# Patient Record
Sex: Female | Born: 1945
Health system: Southern US, Community
[De-identification: ages and names within clinical notes are randomized; demographics above are authoritative.]

## PROBLEM LIST (undated history)

## (undated) DIAGNOSIS — A809 Acute poliomyelitis, unspecified: Secondary | ICD-10-CM

## (undated) HISTORY — DX: Acute poliomyelitis, unspecified: A80.9

---

## 2012-12-08 ENCOUNTER — Other Ambulatory Visit: Payer: Self-pay

## 2012-12-08 DIAGNOSIS — Z1231 Encounter for screening mammogram for malignant neoplasm of breast: Secondary | ICD-10-CM

## 2013-01-06 ENCOUNTER — Ambulatory Visit
Admission: RE | Admit: 2013-01-06 | Discharge: 2013-01-06 | Disposition: A | Discharge status: Home / Self Care | Payer: Medicare Other | Source: Ambulatory Visit

## 2013-01-06 DIAGNOSIS — Z1231 Encounter for screening mammogram for malignant neoplasm of breast: Secondary | ICD-10-CM

## 2013-01-10 ENCOUNTER — Other Ambulatory Visit: Payer: Self-pay | Admitting: Obstetrics and Gynecology

## 2013-01-10 DIAGNOSIS — R928 Other abnormal and inconclusive findings on diagnostic imaging of breast: Secondary | ICD-10-CM

## 2013-01-12 ENCOUNTER — Other Ambulatory Visit: Payer: Self-pay | Admitting: Internal Medicine

## 2013-01-12 DIAGNOSIS — R928 Other abnormal and inconclusive findings on diagnostic imaging of breast: Secondary | ICD-10-CM

## 2013-01-24 ENCOUNTER — Ambulatory Visit
Admission: RE | Admit: 2013-01-24 | Discharge: 2013-01-24 | Disposition: A | Discharge status: Home / Self Care | Payer: Medicare Other | Source: Ambulatory Visit | Attending: Obstetrics and Gynecology | Admitting: Obstetrics and Gynecology

## 2013-01-24 DIAGNOSIS — R928 Other abnormal and inconclusive findings on diagnostic imaging of breast: Secondary | ICD-10-CM

## 2015-09-23 DIAGNOSIS — H9313 Tinnitus, bilateral: Secondary | ICD-10-CM | POA: Diagnosis not present

## 2015-09-23 DIAGNOSIS — H903 Sensorineural hearing loss, bilateral: Secondary | ICD-10-CM | POA: Diagnosis not present

## 2016-04-27 DIAGNOSIS — Z1231 Encounter for screening mammogram for malignant neoplasm of breast: Secondary | ICD-10-CM | POA: Diagnosis not present

## 2016-04-27 DIAGNOSIS — Z6825 Body mass index (BMI) 25.0-25.9, adult: Secondary | ICD-10-CM | POA: Diagnosis not present

## 2016-04-27 DIAGNOSIS — Z01419 Encounter for gynecological examination (general) (routine) without abnormal findings: Secondary | ICD-10-CM | POA: Diagnosis not present

## 2016-04-29 DIAGNOSIS — Z23 Encounter for immunization: Secondary | ICD-10-CM | POA: Diagnosis not present

## 2016-04-29 DIAGNOSIS — E559 Vitamin D deficiency, unspecified: Secondary | ICD-10-CM | POA: Diagnosis not present

## 2016-04-29 DIAGNOSIS — E785 Hyperlipidemia, unspecified: Secondary | ICD-10-CM | POA: Diagnosis not present

## 2016-04-29 DIAGNOSIS — Z0000 Encounter for general adult medical examination without abnormal findings: Secondary | ICD-10-CM | POA: Diagnosis not present

## 2016-05-06 DIAGNOSIS — F17211 Nicotine dependence, cigarettes, in remission: Secondary | ICD-10-CM | POA: Diagnosis not present

## 2016-05-06 DIAGNOSIS — R9431 Abnormal electrocardiogram [ECG] [EKG]: Secondary | ICD-10-CM | POA: Diagnosis not present

## 2016-05-06 DIAGNOSIS — Z0001 Encounter for general adult medical examination with abnormal findings: Secondary | ICD-10-CM | POA: Diagnosis not present

## 2016-05-06 DIAGNOSIS — N182 Chronic kidney disease, stage 2 (mild): Secondary | ICD-10-CM | POA: Diagnosis not present

## 2016-06-10 DIAGNOSIS — R600 Localized edema: Secondary | ICD-10-CM | POA: Diagnosis not present

## 2016-06-23 DIAGNOSIS — Z23 Encounter for immunization: Secondary | ICD-10-CM | POA: Diagnosis not present

## 2016-06-23 DIAGNOSIS — D1800 Hemangioma unspecified site: Secondary | ICD-10-CM | POA: Diagnosis not present

## 2016-06-23 DIAGNOSIS — D225 Melanocytic nevi of trunk: Secondary | ICD-10-CM | POA: Diagnosis not present

## 2016-06-23 DIAGNOSIS — L309 Dermatitis, unspecified: Secondary | ICD-10-CM | POA: Diagnosis not present

## 2016-06-23 DIAGNOSIS — L853 Xerosis cutis: Secondary | ICD-10-CM | POA: Diagnosis not present

## 2016-06-23 DIAGNOSIS — L814 Other melanin hyperpigmentation: Secondary | ICD-10-CM | POA: Diagnosis not present

## 2016-06-23 DIAGNOSIS — L821 Other seborrheic keratosis: Secondary | ICD-10-CM | POA: Diagnosis not present

## 2016-07-31 DIAGNOSIS — H5213 Myopia, bilateral: Secondary | ICD-10-CM | POA: Diagnosis not present

## 2016-10-22 DIAGNOSIS — L719 Rosacea, unspecified: Secondary | ICD-10-CM | POA: Diagnosis not present

## 2017-01-19 DIAGNOSIS — L719 Rosacea, unspecified: Secondary | ICD-10-CM | POA: Diagnosis not present

## 2017-05-04 DIAGNOSIS — Z6826 Body mass index (BMI) 26.0-26.9, adult: Secondary | ICD-10-CM | POA: Diagnosis not present

## 2017-05-04 DIAGNOSIS — Z01419 Encounter for gynecological examination (general) (routine) without abnormal findings: Secondary | ICD-10-CM | POA: Diagnosis not present

## 2017-05-04 DIAGNOSIS — Z1231 Encounter for screening mammogram for malignant neoplasm of breast: Secondary | ICD-10-CM | POA: Diagnosis not present

## 2017-05-04 DIAGNOSIS — Z124 Encounter for screening for malignant neoplasm of cervix: Secondary | ICD-10-CM | POA: Diagnosis not present

## 2017-05-04 DIAGNOSIS — M8588 Other specified disorders of bone density and structure, other site: Secondary | ICD-10-CM | POA: Diagnosis not present

## 2017-05-06 DIAGNOSIS — Z7982 Long term (current) use of aspirin: Secondary | ICD-10-CM | POA: Diagnosis not present

## 2017-05-06 DIAGNOSIS — Z0000 Encounter for general adult medical examination without abnormal findings: Secondary | ICD-10-CM | POA: Diagnosis not present

## 2017-05-06 DIAGNOSIS — E785 Hyperlipidemia, unspecified: Secondary | ICD-10-CM | POA: Diagnosis not present

## 2017-05-11 DIAGNOSIS — E785 Hyperlipidemia, unspecified: Secondary | ICD-10-CM | POA: Diagnosis not present

## 2017-05-11 DIAGNOSIS — F17211 Nicotine dependence, cigarettes, in remission: Secondary | ICD-10-CM | POA: Diagnosis not present

## 2017-05-11 DIAGNOSIS — M810 Age-related osteoporosis without current pathological fracture: Secondary | ICD-10-CM | POA: Diagnosis not present

## 2017-05-11 DIAGNOSIS — Z0000 Encounter for general adult medical examination without abnormal findings: Secondary | ICD-10-CM | POA: Diagnosis not present

## 2017-05-31 ENCOUNTER — Telehealth: Payer: Self-pay | Admitting: Acute Care

## 2017-06-01 NOTE — Telephone Encounter (Signed)
Routing to lung nodule pool for follow up.  

## 2017-06-04 NOTE — Telephone Encounter (Signed)
Called and spoke with pt and she is aware that DP will be calling her back next week.

## 2017-06-07 NOTE — Telephone Encounter (Signed)
LMTC x 1  

## 2017-06-14 ENCOUNTER — Institutional Professional Consult (permissible substitution): Payer: Self-pay | Admitting: Acute Care

## 2017-06-14 ENCOUNTER — Other Ambulatory Visit: Payer: Self-pay | Admitting: Acute Care

## 2017-06-14 ENCOUNTER — Telehealth: Payer: Self-pay | Admitting: Acute Care

## 2017-06-14 DIAGNOSIS — Z122 Encounter for screening for malignant neoplasm of respiratory organs: Secondary | ICD-10-CM

## 2017-06-14 DIAGNOSIS — Z87891 Personal history of nicotine dependence: Secondary | ICD-10-CM

## 2017-06-14 NOTE — Telephone Encounter (Signed)
Will close this message and refer to referral notes 

## 2017-06-14 NOTE — Telephone Encounter (Signed)
error 

## 2017-06-14 NOTE — Telephone Encounter (Signed)
LMTC x 1  

## 2017-06-21 ENCOUNTER — Encounter: Payer: Self-pay | Admitting: Acute Care

## 2017-06-21 ENCOUNTER — Inpatient Hospital Stay: Admission: RE | Admit: 2017-06-21 | Payer: Self-pay | Source: Ambulatory Visit

## 2017-06-24 ENCOUNTER — Telehealth: Payer: Self-pay | Admitting: Acute Care

## 2017-06-25 NOTE — Telephone Encounter (Signed)
Routing to lung nodule pool for follow up.  

## 2017-06-25 NOTE — Telephone Encounter (Signed)
LMTC x 1  

## 2017-06-25 NOTE — Telephone Encounter (Signed)
[facility] Nothing further needed

## 2017-06-29 DIAGNOSIS — D1800 Hemangioma unspecified site: Secondary | ICD-10-CM | POA: Diagnosis not present

## 2017-06-29 DIAGNOSIS — L814 Other melanin hyperpigmentation: Secondary | ICD-10-CM | POA: Diagnosis not present

## 2017-06-29 DIAGNOSIS — D485 Neoplasm of uncertain behavior of skin: Secondary | ICD-10-CM | POA: Diagnosis not present

## 2017-06-29 DIAGNOSIS — L570 Actinic keratosis: Secondary | ICD-10-CM | POA: Diagnosis not present

## 2017-06-29 DIAGNOSIS — L821 Other seborrheic keratosis: Secondary | ICD-10-CM | POA: Diagnosis not present

## 2017-07-08 DIAGNOSIS — Z1211 Encounter for screening for malignant neoplasm of colon: Secondary | ICD-10-CM | POA: Diagnosis not present

## 2017-07-08 DIAGNOSIS — Z86010 Personal history of colonic polyps: Secondary | ICD-10-CM | POA: Diagnosis not present

## 2017-07-08 DIAGNOSIS — K5730 Diverticulosis of large intestine without perforation or abscess without bleeding: Secondary | ICD-10-CM | POA: Diagnosis not present

## 2017-07-09 ENCOUNTER — Encounter: Payer: Self-pay | Admitting: Acute Care

## 2017-07-09 ENCOUNTER — Ambulatory Visit (INDEPENDENT_AMBULATORY_CARE_PROVIDER_SITE_OTHER)
Admission: RE | Admit: 2017-07-09 | Discharge: 2017-07-09 | Disposition: A | Discharge status: Home / Self Care | Payer: Medicare Other | Source: Ambulatory Visit | Attending: Acute Care | Admitting: Acute Care

## 2017-07-09 ENCOUNTER — Ambulatory Visit (INDEPENDENT_AMBULATORY_CARE_PROVIDER_SITE_OTHER): Payer: Medicare Other | Admitting: Acute Care

## 2017-07-09 DIAGNOSIS — Z87891 Personal history of nicotine dependence: Secondary | ICD-10-CM

## 2017-07-09 DIAGNOSIS — Z122 Encounter for screening for malignant neoplasm of respiratory organs: Secondary | ICD-10-CM

## 2017-07-09 DIAGNOSIS — Z23 Encounter for immunization: Secondary | ICD-10-CM | POA: Diagnosis not present

## 2017-07-09 NOTE — Progress Notes (Signed)
Shared Decision Making Visit Lung Cancer Screening Program [phone]([id number])   Eligibility:  Age 71 y.o.  Pack Years Smoking History Calculation 45 pack year smoking history (# packs/per year x # years smoked)  Recent History of coughing up blood  no  Unexplained weight loss? no ( >Than 15 pounds within the last 6 months )  Prior History Lung / other cancer no (Diagnosis within the last 5 years already requiring surveillance chest CT Scans).  Smoking Status Former Smoker  Former Smokers: Years since quit: 10 years  Quit Date: 2008  Visit Components:  Discussion included one or more decision making aids. yes  Discussion included risk/benefits of screening. yes  Discussion included potential follow up diagnostic testing for abnormal scans. yes  Discussion included meaning and risk of over diagnosis. yes  Discussion included meaning and risk of False Positives. yes  Discussion included meaning of total radiation exposure. yes  Counseling Included:  Importance of adherence to annual lung cancer LDCT screening. yes  Impact of comorbidities on ability to participate in the program. yes  Ability and willingness to under diagnostic treatment. yes  Smoking Cessation Counseling:  Current Smokers:   Discussed importance of smoking cessation. NA, former smoker  Information about tobacco cessation classes and interventions provided to patient. yes  Patient provided with "ticket" for LDCT Scan. yes  Symptomatic Patient. no  Counseling  Diagnosis Code: Tobacco Use Z72.0  Asymptomatic Patient yes  Counseling (Intermediate counseling: > three minutes counseling) [id number]  Former Smokers:   Discussed the importance of maintaining cigarette abstinence. yes  Diagnosis Code: Personal History of Nicotine Dependence. U98.119Z87.891  Information about tobacco cessation classes and interventions provided to patient. Yes  Patient provided with "ticket" for LDCT Scan. yes  Written Order  for Lung Cancer Screening with LDCT placed in Epic. Yes (CT Chest Lung Cancer Screening Low Dose W/O CM) [id number] Z12.2-Screening of respiratory organs Z87.891-Personal history of nicotine dependence  I spent 25 minutes of face to face time with Ms. Edgell discussing the risks and benefits of lung cancer screening. We viewed a power point together that explained in detail the above noted topics. We took the time to pause the power point at intervals to allow for questions to be asked and answered to ensure understanding. We discussed that she had taken the single most powerful action possible to decrease her risk of developing lung cancer when she quit smoking. I counseled her to remain smoke free, and to contact me if she ever had the desire to smoke again so that I can provide resources and tools to help support the effort to remain smoke free. We discussed the time and location of the scan, and that either  Abigail Miyamotoenise Phelps RN or I will call with the results within  24-48 hours of receiving them. She has my card and contact information in the event she needs to speak with me, in addition to a copy of the power point we reviewed as a resource. She verbalized understanding of all of the above and had no further questions upon leaving the office.     I explained to the patient that there has been a high incidence of coronary artery disease noted on these exams. I explained that this is a non-gated exam therefore degree or severity cannot be determined. This patient is not on statin therapy. I have asked the patient to follow-up with their PCP regarding any incidental finding of coronary artery disease and management with diet or medication as they feel  is clinically indicated. The patient verbalized understanding of the above and had no further questions.    Bevelyn NgoSarah F Groce, NP 07/09/2017

## 2017-07-15 ENCOUNTER — Telehealth: Payer: Self-pay | Admitting: Acute Care

## 2017-07-15 DIAGNOSIS — M4180 Other forms of scoliosis, site unspecified: Secondary | ICD-10-CM | POA: Diagnosis not present

## 2017-07-15 DIAGNOSIS — M25551 Pain in right hip: Secondary | ICD-10-CM | POA: Diagnosis not present

## 2017-07-15 DIAGNOSIS — Z87891 Personal history of nicotine dependence: Secondary | ICD-10-CM

## 2017-07-15 DIAGNOSIS — Z122 Encounter for screening for malignant neoplasm of respiratory organs: Secondary | ICD-10-CM

## 2017-07-15 DIAGNOSIS — M25552 Pain in left hip: Secondary | ICD-10-CM | POA: Diagnosis not present

## 2017-07-15 NOTE — Telephone Encounter (Signed)
Pt informed of CT results per Sarah Groce, NP.  PT verbalized understanding.  Copy sent to PCP.  Order placed for 1 yr f/u CT.  

## 2017-08-06 DIAGNOSIS — Z1211 Encounter for screening for malignant neoplasm of colon: Secondary | ICD-10-CM | POA: Diagnosis not present

## 2017-08-06 DIAGNOSIS — Z86010 Personal history of colonic polyps: Secondary | ICD-10-CM | POA: Diagnosis not present

## 2017-08-06 DIAGNOSIS — K5730 Diverticulosis of large intestine without perforation or abscess without bleeding: Secondary | ICD-10-CM | POA: Diagnosis not present

## 2017-09-13 DIAGNOSIS — L570 Actinic keratosis: Secondary | ICD-10-CM | POA: Diagnosis not present

## 2017-09-13 DIAGNOSIS — Z23 Encounter for immunization: Secondary | ICD-10-CM | POA: Diagnosis not present

## 2017-10-15 DIAGNOSIS — H5211 Myopia, right eye: Secondary | ICD-10-CM | POA: Diagnosis not present

## 2018-03-23 DIAGNOSIS — Z23 Encounter for immunization: Secondary | ICD-10-CM | POA: Diagnosis not present

## 2018-05-16 DIAGNOSIS — Z1231 Encounter for screening mammogram for malignant neoplasm of breast: Secondary | ICD-10-CM | POA: Diagnosis not present

## 2018-05-16 DIAGNOSIS — Z6825 Body mass index (BMI) 25.0-25.9, adult: Secondary | ICD-10-CM | POA: Diagnosis not present

## 2018-05-16 DIAGNOSIS — Z01419 Encounter for gynecological examination (general) (routine) without abnormal findings: Secondary | ICD-10-CM | POA: Diagnosis not present

## 2018-05-20 DIAGNOSIS — Z1159 Encounter for screening for other viral diseases: Secondary | ICD-10-CM | POA: Diagnosis not present

## 2018-05-20 DIAGNOSIS — E785 Hyperlipidemia, unspecified: Secondary | ICD-10-CM | POA: Diagnosis not present

## 2018-05-20 DIAGNOSIS — Z7982 Long term (current) use of aspirin: Secondary | ICD-10-CM | POA: Diagnosis not present

## 2018-05-25 DIAGNOSIS — M810 Age-related osteoporosis without current pathological fracture: Secondary | ICD-10-CM | POA: Diagnosis not present

## 2018-05-25 DIAGNOSIS — E785 Hyperlipidemia, unspecified: Secondary | ICD-10-CM | POA: Diagnosis not present

## 2018-05-25 DIAGNOSIS — I351 Nonrheumatic aortic (valve) insufficiency: Secondary | ICD-10-CM | POA: Diagnosis not present

## 2018-05-25 DIAGNOSIS — Z0000 Encounter for general adult medical examination without abnormal findings: Secondary | ICD-10-CM | POA: Diagnosis not present

## 2018-06-28 DIAGNOSIS — D225 Melanocytic nevi of trunk: Secondary | ICD-10-CM | POA: Diagnosis not present

## 2018-06-28 DIAGNOSIS — Z23 Encounter for immunization: Secondary | ICD-10-CM | POA: Diagnosis not present

## 2018-06-28 DIAGNOSIS — L821 Other seborrheic keratosis: Secondary | ICD-10-CM | POA: Diagnosis not present

## 2018-06-28 DIAGNOSIS — L814 Other melanin hyperpigmentation: Secondary | ICD-10-CM | POA: Diagnosis not present

## 2018-07-11 ENCOUNTER — Ambulatory Visit (INDEPENDENT_AMBULATORY_CARE_PROVIDER_SITE_OTHER)
Admission: RE | Admit: 2018-07-11 | Discharge: 2018-07-11 | Disposition: A | Discharge status: Home / Self Care | Payer: Medicare Other | Source: Ambulatory Visit | Attending: Acute Care | Admitting: Acute Care

## 2018-07-11 DIAGNOSIS — Z87891 Personal history of nicotine dependence: Secondary | ICD-10-CM | POA: Diagnosis not present

## 2018-07-11 DIAGNOSIS — Z122 Encounter for screening for malignant neoplasm of respiratory organs: Secondary | ICD-10-CM

## 2018-07-15 ENCOUNTER — Other Ambulatory Visit: Payer: Self-pay | Admitting: Acute Care

## 2018-07-15 DIAGNOSIS — Z87891 Personal history of nicotine dependence: Secondary | ICD-10-CM

## 2018-07-15 DIAGNOSIS — Z122 Encounter for screening for malignant neoplasm of respiratory organs: Secondary | ICD-10-CM

## 2018-11-14 DIAGNOSIS — M7711 Lateral epicondylitis, right elbow: Secondary | ICD-10-CM | POA: Diagnosis not present

## 2019-01-30 DIAGNOSIS — Z961 Presence of intraocular lens: Secondary | ICD-10-CM | POA: Diagnosis not present

## 2019-01-30 DIAGNOSIS — D3131 Benign neoplasm of right choroid: Secondary | ICD-10-CM | POA: Diagnosis not present

## 2019-02-14 DIAGNOSIS — M25522 Pain in left elbow: Secondary | ICD-10-CM | POA: Diagnosis not present

## 2019-02-14 DIAGNOSIS — M7711 Lateral epicondylitis, right elbow: Secondary | ICD-10-CM | POA: Diagnosis not present

## 2019-04-18 DIAGNOSIS — M25521 Pain in right elbow: Secondary | ICD-10-CM | POA: Diagnosis not present

## 2019-04-18 DIAGNOSIS — M7711 Lateral epicondylitis, right elbow: Secondary | ICD-10-CM | POA: Diagnosis not present

## 2019-04-24 DIAGNOSIS — M25521 Pain in right elbow: Secondary | ICD-10-CM | POA: Diagnosis not present

## 2019-04-25 DIAGNOSIS — Z0120 Encounter for dental examination and cleaning without abnormal findings: Secondary | ICD-10-CM | POA: Diagnosis not present

## 2019-04-27 DIAGNOSIS — M25521 Pain in right elbow: Secondary | ICD-10-CM | POA: Diagnosis not present

## 2019-05-02 DIAGNOSIS — M25521 Pain in right elbow: Secondary | ICD-10-CM | POA: Diagnosis not present

## 2019-05-05 DIAGNOSIS — M25521 Pain in right elbow: Secondary | ICD-10-CM | POA: Diagnosis not present

## 2019-05-11 DIAGNOSIS — M25521 Pain in right elbow: Secondary | ICD-10-CM | POA: Diagnosis not present

## 2019-05-26 DIAGNOSIS — Z7982 Long term (current) use of aspirin: Secondary | ICD-10-CM | POA: Diagnosis not present

## 2019-05-26 DIAGNOSIS — E785 Hyperlipidemia, unspecified: Secondary | ICD-10-CM | POA: Diagnosis not present

## 2019-05-26 DIAGNOSIS — M810 Age-related osteoporosis without current pathological fracture: Secondary | ICD-10-CM | POA: Diagnosis not present

## 2019-05-30 DIAGNOSIS — M810 Age-related osteoporosis without current pathological fracture: Secondary | ICD-10-CM | POA: Diagnosis not present

## 2019-05-31 DIAGNOSIS — Z0000 Encounter for general adult medical examination without abnormal findings: Secondary | ICD-10-CM | POA: Diagnosis not present

## 2019-05-31 DIAGNOSIS — M810 Age-related osteoporosis without current pathological fracture: Secondary | ICD-10-CM | POA: Diagnosis not present

## 2019-05-31 DIAGNOSIS — I700 Atherosclerosis of aorta: Secondary | ICD-10-CM | POA: Diagnosis not present

## 2019-05-31 DIAGNOSIS — J439 Emphysema, unspecified: Secondary | ICD-10-CM | POA: Diagnosis not present

## 2019-06-01 DIAGNOSIS — M25521 Pain in right elbow: Secondary | ICD-10-CM | POA: Diagnosis not present

## 2019-06-13 DIAGNOSIS — M7711 Lateral epicondylitis, right elbow: Secondary | ICD-10-CM | POA: Diagnosis not present

## 2019-07-18 ENCOUNTER — Other Ambulatory Visit: Payer: Self-pay

## 2019-07-18 ENCOUNTER — Ambulatory Visit (INDEPENDENT_AMBULATORY_CARE_PROVIDER_SITE_OTHER)
Admission: RE | Admit: 2019-07-18 | Discharge: 2019-07-18 | Disposition: A | Discharge status: Home / Self Care | Payer: Medicare Other | Source: Ambulatory Visit | Attending: Acute Care | Admitting: Acute Care

## 2019-07-18 DIAGNOSIS — Z122 Encounter for screening for malignant neoplasm of respiratory organs: Secondary | ICD-10-CM

## 2019-07-18 DIAGNOSIS — Z87891 Personal history of nicotine dependence: Secondary | ICD-10-CM | POA: Diagnosis not present

## 2019-07-20 ENCOUNTER — Other Ambulatory Visit: Payer: Self-pay | Admitting: *Deleted

## 2019-07-20 DIAGNOSIS — Z87891 Personal history of nicotine dependence: Secondary | ICD-10-CM

## 2019-07-25 DIAGNOSIS — M1990 Unspecified osteoarthritis, unspecified site: Secondary | ICD-10-CM | POA: Insufficient documentation

## 2019-07-25 DIAGNOSIS — M810 Age-related osteoporosis without current pathological fracture: Secondary | ICD-10-CM | POA: Insufficient documentation

## 2019-07-27 DIAGNOSIS — Z6826 Body mass index (BMI) 26.0-26.9, adult: Secondary | ICD-10-CM | POA: Diagnosis not present

## 2019-07-27 DIAGNOSIS — Z01419 Encounter for gynecological examination (general) (routine) without abnormal findings: Secondary | ICD-10-CM | POA: Diagnosis not present

## 2019-07-27 DIAGNOSIS — Z1231 Encounter for screening mammogram for malignant neoplasm of breast: Secondary | ICD-10-CM | POA: Diagnosis not present

## 2019-07-28 DIAGNOSIS — H903 Sensorineural hearing loss, bilateral: Secondary | ICD-10-CM | POA: Diagnosis not present

## 2019-08-04 ENCOUNTER — Ambulatory Visit: Payer: Medicare Other | Attending: Internal Medicine

## 2019-08-04 DIAGNOSIS — Z23 Encounter for immunization: Secondary | ICD-10-CM

## 2019-08-04 NOTE — Progress Notes (Signed)
   Covid-19 Vaccination Clinic  Name:  Sheryl Arias    MRN: [id number] DOB: 1946-05-17  08/04/2019  Sheryl Arias was observed post Covid-19 immunization for 15 minutes without incidence. She was provided with Vaccine Information Sheet and instruction to access the V-Safe system.   Sheryl Arias was instructed to call 911 with any severe reactions post vaccine: Marland Kitchen Difficulty breathing  . Swelling of your face and throat  . A fast heartbeat  . A bad rash all over your body  . Dizziness and weakness    Immunizations Administered    Name Date Dose VIS Date Route   Pfizer COVID-19 Vaccine 08/04/2019  1:46 PM 0.3 mL 06/23/2019 Intramuscular   Manufacturer: [employer], [employer]   Lot: [id number]   NDC: [address]-4

## 2019-08-25 ENCOUNTER — Ambulatory Visit: Payer: Medicare Other | Attending: Internal Medicine

## 2019-08-25 DIAGNOSIS — Z23 Encounter for immunization: Secondary | ICD-10-CM

## 2019-08-25 NOTE — Progress Notes (Signed)
   Covid-19 Vaccination Clinic  Name:  Sheryl Arias    MRN: [id number] DOB: 01-Aug-1945  08/25/2019  Ms. Sheryl Arias was observed post Covid-19 immunization for 15 minutes without incidence. She was provided with Vaccine Information Sheet and instruction to access the V-Safe system.   Ms. Sheryl Arias was instructed to call 911 with any severe reactions post vaccine: Marland Kitchen Difficulty breathing  . Swelling of your face and throat  . A fast heartbeat  . A bad rash all over your body  . Dizziness and weakness    Immunizations Administered    Name Date Dose VIS Date Route   Pfizer COVID-19 Vaccine 08/25/2019  8:39 AM 0.3 mL 06/23/2019 Intramuscular   Manufacturer: [employer], [employer]   Lot: [id number]   NDC: [address]-8

## 2019-09-15 DIAGNOSIS — M7711 Lateral epicondylitis, right elbow: Secondary | ICD-10-CM | POA: Diagnosis not present

## 2019-09-21 DIAGNOSIS — Z0120 Encounter for dental examination and cleaning without abnormal findings: Secondary | ICD-10-CM | POA: Diagnosis not present

## 2019-11-08 DIAGNOSIS — M542 Cervicalgia: Secondary | ICD-10-CM | POA: Diagnosis not present

## 2019-11-13 DIAGNOSIS — M542 Cervicalgia: Secondary | ICD-10-CM | POA: Diagnosis not present

## 2019-11-27 DIAGNOSIS — M542 Cervicalgia: Secondary | ICD-10-CM | POA: Diagnosis not present

## 2019-12-12 DIAGNOSIS — L570 Actinic keratosis: Secondary | ICD-10-CM | POA: Diagnosis not present

## 2020-02-01 DIAGNOSIS — H5213 Myopia, bilateral: Secondary | ICD-10-CM | POA: Diagnosis not present

## 2020-04-09 DIAGNOSIS — Z0120 Encounter for dental examination and cleaning without abnormal findings: Secondary | ICD-10-CM | POA: Diagnosis not present

## 2020-04-16 ENCOUNTER — Ambulatory Visit: Payer: Medicare Other | Attending: Internal Medicine

## 2020-04-16 DIAGNOSIS — Z23 Encounter for immunization: Secondary | ICD-10-CM

## 2020-04-16 NOTE — Progress Notes (Signed)
   Covid-19 Vaccination Clinic  Name:  Sheryl Arias    MRN: [id number] DOB: 1946-04-23  04/16/2020  Sheryl Arias was observed post Covid-19 immunization for 15 minutes without incident. She was provided with Vaccine Information Sheet and instruction to access the V-Safe system.   Sheryl Arias was instructed to call 911 with any severe reactions post vaccine: Marland Kitchen Difficulty breathing  . Swelling of face and throat  . A fast heartbeat  . A bad rash all over body  . Dizziness and weakness

## 2020-06-26 DIAGNOSIS — J439 Emphysema, unspecified: Secondary | ICD-10-CM | POA: Diagnosis not present

## 2020-06-26 DIAGNOSIS — Z7982 Long term (current) use of aspirin: Secondary | ICD-10-CM | POA: Diagnosis not present

## 2020-06-26 DIAGNOSIS — E785 Hyperlipidemia, unspecified: Secondary | ICD-10-CM | POA: Diagnosis not present

## 2020-07-03 DIAGNOSIS — M7542 Impingement syndrome of left shoulder: Secondary | ICD-10-CM | POA: Diagnosis not present

## 2020-07-03 DIAGNOSIS — M810 Age-related osteoporosis without current pathological fracture: Secondary | ICD-10-CM | POA: Diagnosis not present

## 2020-07-03 DIAGNOSIS — Z0000 Encounter for general adult medical examination without abnormal findings: Secondary | ICD-10-CM | POA: Diagnosis not present

## 2020-07-03 DIAGNOSIS — J439 Emphysema, unspecified: Secondary | ICD-10-CM | POA: Diagnosis not present

## 2020-07-03 DIAGNOSIS — I700 Atherosclerosis of aorta: Secondary | ICD-10-CM | POA: Diagnosis not present

## 2020-07-04 DIAGNOSIS — M25551 Pain in right hip: Secondary | ICD-10-CM | POA: Diagnosis not present

## 2020-07-04 DIAGNOSIS — M7061 Trochanteric bursitis, right hip: Secondary | ICD-10-CM | POA: Diagnosis not present

## 2020-07-08 DIAGNOSIS — L814 Other melanin hyperpigmentation: Secondary | ICD-10-CM | POA: Diagnosis not present

## 2020-07-08 DIAGNOSIS — D225 Melanocytic nevi of trunk: Secondary | ICD-10-CM | POA: Diagnosis not present

## 2020-07-08 DIAGNOSIS — Z23 Encounter for immunization: Secondary | ICD-10-CM | POA: Diagnosis not present

## 2020-07-08 DIAGNOSIS — L821 Other seborrheic keratosis: Secondary | ICD-10-CM | POA: Diagnosis not present

## 2020-07-15 ENCOUNTER — Telehealth: Payer: Self-pay | Admitting: Acute Care

## 2020-07-16 NOTE — Telephone Encounter (Signed)
Left message for the patient to return my call to schedule her LCS CT left my phone # [phone]

## 2020-07-16 NOTE — Telephone Encounter (Signed)
I now have spoken with Mrs. Kitchings and her LCS CT has been scheduled on 08/07/2020 @ 9:00am at [facility]

## 2020-07-16 NOTE — Telephone Encounter (Signed)
Synetta Fail, can you contact pt to schedule her f/u low dose screening CT?

## 2020-08-07 ENCOUNTER — Ambulatory Visit (INDEPENDENT_AMBULATORY_CARE_PROVIDER_SITE_OTHER)
Admission: RE | Admit: 2020-08-07 | Discharge: 2020-08-07 | Disposition: A | Discharge status: Home / Self Care | Payer: Medicare Other | Source: Ambulatory Visit | Attending: Internal Medicine | Admitting: Internal Medicine

## 2020-08-07 ENCOUNTER — Other Ambulatory Visit: Payer: Self-pay

## 2020-08-07 DIAGNOSIS — Z6826 Body mass index (BMI) 26.0-26.9, adult: Secondary | ICD-10-CM | POA: Diagnosis not present

## 2020-08-07 DIAGNOSIS — Z87891 Personal history of nicotine dependence: Secondary | ICD-10-CM | POA: Diagnosis not present

## 2020-08-07 DIAGNOSIS — Z1231 Encounter for screening mammogram for malignant neoplasm of breast: Secondary | ICD-10-CM | POA: Diagnosis not present

## 2020-08-07 DIAGNOSIS — Z01419 Encounter for gynecological examination (general) (routine) without abnormal findings: Secondary | ICD-10-CM | POA: Diagnosis not present

## 2020-08-13 DIAGNOSIS — L814 Other melanin hyperpigmentation: Secondary | ICD-10-CM | POA: Diagnosis not present

## 2020-08-13 DIAGNOSIS — L821 Other seborrheic keratosis: Secondary | ICD-10-CM | POA: Diagnosis not present

## 2020-08-15 NOTE — Progress Notes (Signed)
Please call patient and let them  know their  low dose Ct was read as a Lung RADS 2: nodules that are benign in appearance and behavior with a very low likelihood of becoming a clinically active cancer due to size or lack of growth. Recommendation per radiology is for a repeat LDCT in 12 months. .Please let them  know we will order and schedule their  annual screening scan for 07/2021. Please let them  know there was notation of CAD on their  scan.  Please remind the patient  that this is a non-gated exam therefore degree or severity of disease  cannot be determined. Please have them  follow up with their PCP regarding potential risk factor modification, dietary therapy or pharmacologic therapy if clinically indicated. Pt.  is not  currently on statin therapy. Please place order for annual  screening scan for  07/2021 and fax results to PCP. Thanks so much.  There is notation of CAD on the scan. Patient is not on statin therapy that I can see in Epic.Marland Kitchen Please have her follow up with her PCP regarding potential risk factor modification, dietary therapy or pharmacologic therapy if clinically indicated. Thanks so much

## 2020-08-19 ENCOUNTER — Other Ambulatory Visit: Payer: Self-pay | Admitting: *Deleted

## 2020-08-19 DIAGNOSIS — Z87891 Personal history of nicotine dependence: Secondary | ICD-10-CM

## 2020-08-26 ENCOUNTER — Other Ambulatory Visit: Payer: Self-pay

## 2020-08-26 ENCOUNTER — Encounter: Payer: Self-pay | Admitting: Cardiology

## 2020-08-26 ENCOUNTER — Ambulatory Visit: Payer: Medicare Other | Admitting: Cardiology

## 2020-08-26 VITALS — BP 136/72 | HR 64 | Ht 63.0 in | Wt 151.0 lb

## 2020-08-26 DIAGNOSIS — Z79899 Other long term (current) drug therapy: Secondary | ICD-10-CM | POA: Diagnosis not present

## 2020-08-26 DIAGNOSIS — I700 Atherosclerosis of aorta: Secondary | ICD-10-CM | POA: Diagnosis not present

## 2020-08-26 DIAGNOSIS — Z7189 Other specified counseling: Secondary | ICD-10-CM

## 2020-08-26 DIAGNOSIS — Z7182 Exercise counseling: Secondary | ICD-10-CM

## 2020-08-26 DIAGNOSIS — I2510 Atherosclerotic heart disease of native coronary artery without angina pectoris: Secondary | ICD-10-CM | POA: Diagnosis not present

## 2020-08-26 DIAGNOSIS — Z713 Dietary counseling and surveillance: Secondary | ICD-10-CM

## 2020-08-26 MED ORDER — ROSUVASTATIN CALCIUM 5 MG PO TABS: 5.0000 mg | ORAL_TABLET | Freq: Every day | ORAL | 3 refills | Status: DC

## 2020-08-26 NOTE — Patient Instructions (Addendum)
Medication Instructions:  Start Rosuvastatin 5 mg daily  *If you need a refill on your cardiac medications before your next appointment, please call your pharmacy*   Lab Work: Your physician recommends that you return for lab work in 2-3 month (Fasting Lipid, LFT).  If you have labs (blood work) drawn today and your tests are completely normal, you will receive your results only by: Marland Kitchen MyChart Message (if you have MyChart) OR . A paper copy in the mail If you have any lab test that is abnormal or we need to change your treatment, we will call you to review the results.   Testing/Procedures: None   Follow-Up: At [facility], you and your health needs are our priority.  As part of our continuing mission to provide you with exceptional heart care, we have created designated Provider Care Teams.  These Care Teams include your primary Cardiologist (physician) and Advanced Practice Providers (APPs -  Physician Assistants and Nurse Practitioners) who all work together to provide you with the care you need, when you need it.  We recommend signing up for the patient portal called "MyChart".  Sign up information is provided on this After Visit Summary.  MyChart is used to connect with patients for Virtual Visits (Telemedicine).  Patients are able to view lab/test results, encounter notes, upcoming appointments, etc.  Non-urgent messages can be sent to your provider as well.   To learn more about what you can do with MyChart, go to [email or web address].    Your next appointment:   1 year(s)  The format for your next appointment:   In Person  Provider:   Jodelle Red, MD      Mediterranean Diet A Mediterranean diet refers to food and lifestyle choices that are based on the traditions of countries located on the [place]. This way of eating has been shown to help prevent certain conditions and improve outcomes for people who have chronic diseases, like kidney disease  and heart disease. What are tips for following this plan? Lifestyle  Cook and eat meals together with your family, when possible.  Drink enough fluid to keep your urine clear or pale yellow.  Be physically active every day. This includes: ? Aerobic exercise like running or swimming. ? Leisure activities like gardening, walking, or housework.  Get 7-8 hours of sleep each night.  If recommended by your health care provider, drink red wine in moderation. This means 1 glass a day for nonpregnant women and 2 glasses a day for men. A glass of wine equals 5 oz (150 mL). Reading food labels  Check the serving size of packaged foods. For foods such as rice and pasta, the serving size refers to the amount of cooked product, not dry.  Check the total fat in packaged foods. Avoid foods that have saturated fat or trans fats.  Check the ingredients list for added sugars, such as corn syrup.   Shopping  At the grocery store, buy most of your food from the areas near the walls of the store. This includes: ? Fresh fruits and vegetables (produce). ? Grains, beans, nuts, and seeds. Some of these may be available in unpackaged forms or large amounts (in bulk). ? Fresh seafood. ? Poultry and eggs. ? Low-fat dairy products.  Buy whole ingredients instead of prepackaged foods.  Buy fresh fruits and vegetables in-season from local farmers markets.  Buy frozen fruits and vegetables in resealable bags.  If you do not have access to quality fresh seafood, buy  precooked frozen shrimp or canned fish, such as tuna, salmon, or sardines.  Buy small amounts of raw or cooked vegetables, salads, or olives from the deli or salad bar at your store.  Stock your pantry so you always have certain foods on hand, such as olive oil, canned tuna, canned tomatoes, rice, pasta, and beans. Cooking  Cook foods with extra-virgin olive oil instead of using butter or other vegetable oils.  Have meat as a side dish, and  have vegetables or grains as your main dish. This means having meat in small portions or adding small amounts of meat to foods like pasta or stew.  Use beans or vegetables instead of meat in common dishes like chili or lasagna.  Experiment with different cooking methods. Try roasting or broiling vegetables instead of steaming or sauteing them.  Add frozen vegetables to soups, stews, pasta, or rice.  Add nuts or seeds for added healthy fat at each meal. You can add these to yogurt, salads, or vegetable dishes.  Marinate fish or vegetables using olive oil, lemon juice, garlic, and fresh herbs. Meal planning  Plan to eat 1 vegetarian meal one day each week. Try to work up to 2 vegetarian meals, if possible.  Eat seafood 2 or more times a week.  Have healthy snacks readily available, such as: ? Vegetable sticks with hummus. ? [place] yogurt. ? Fruit and nut trail mix.  Eat balanced meals throughout the week. This includes: ? Fruit: 2-3 servings a day ? Vegetables: 4-5 servings a day ? Low-fat dairy: 2 servings a day ? Fish, poultry, or lean meat: 1 serving a day ? Beans and legumes: 2 or more servings a week ? Nuts and seeds: 1-2 servings a day ? Whole grains: 6-8 servings a day ? Extra-virgin olive oil: 3-4 servings a day  Limit red meat and sweets to only a few servings a month   What are my food choices?  Mediterranean diet ? Recommended  Grains: Whole-grain pasta. Brown rice. Bulgar wheat. Polenta. Couscous. Whole-wheat bread. Orpah Cobb.  Vegetables: Artichokes. Beets. Broccoli. Cabbage. Carrots. Eggplant. Green beans. Chard. Kale. Spinach. Onions. Leeks. Peas. Squash. Tomatoes. Peppers. Radishes.  Fruits: Apples. Apricots. Avocado. Berries. Bananas. Cherries. Dates. Figs. Grapes. Lemons. Melon. Oranges. Peaches. Plums. Pomegranate.  Meats and other protein foods: Beans. Almonds. Sunflower seeds. Pine nuts. Peanuts. Cod. Salmon. Scallops. Shrimp. Tuna. Tilapia. Clams.  Oysters. Eggs.  Dairy: Low-fat milk. Cheese. Greek yogurt.  Beverages: Water. Red wine. Herbal tea.  Fats and oils: Extra virgin olive oil. Avocado oil. Grape seed oil.  Sweets and desserts: [place] yogurt with honey. Baked apples. Poached pears. Trail mix.  Seasoning and other foods: Basil. Cilantro. Coriander. Cumin. Mint. Parsley. Sage. Rosemary. Tarragon. Garlic. Oregano. Thyme. Pepper. Balsalmic vinegar. Tahini. Hummus. Tomato sauce. Olives. Mushrooms. ? Limit these  Grains: Prepackaged pasta or rice dishes. Prepackaged cereal with added sugar.  Vegetables: Deep fried potatoes (french fries).  Fruits: Fruit canned in syrup.  Meats and other protein foods: Beef. Pork. Lamb. Poultry with skin. Hot dogs. Tomasa Blase.  Dairy: Ice cream. Sour cream. Whole milk.  Beverages: Juice. Sugar-sweetened soft drinks. Beer. Liquor and spirits.  Fats and oils: Butter. Canola oil. Vegetable oil. Beef fat (tallow). Lard.  Sweets and desserts: Cookies. Cakes. Pies. Candy.  Seasoning and other foods: Mayonnaise. Premade sauces and marinades. The items listed may not be a complete list. Talk with your dietitian about what dietary choices are right for you. Summary  The Mediterranean diet includes both food and  lifestyle choices.  Eat a variety of fresh fruits and vegetables, beans, nuts, seeds, and whole grains.  Limit the amount of red meat and sweets that you eat.  Talk with your health care provider about whether it is safe for you to drink red wine in moderation. This means 1 glass a day for nonpregnant women and 2 glasses a day for men. A glass of wine equals 5 oz (150 mL). This information is not intended to replace advice given to you by your health care provider. Make sure you discuss any questions you have with your health care provider. Document Revised: 02/27/2016 Document Reviewed: 02/20/2016 Elsevier Patient Education  2020 [employer].

## 2020-08-26 NOTE — Progress Notes (Signed)
Cardiology Office Note:    Date:  08/26/2020   ID:  Sheryl Arias, DOB 01-Sep-1945, MRN [id number]  PCP:  Merri Brunette, MD  Cardiologist:  Jodelle Red, MD  Referring MD: Merri Brunette, MD   CC: new patient evaluation for coronary atherosclerosis  History of Present Illness:    Sheryl Arias is a 75 y.o. female without cardiac history who is seen as a new consult at the request of Merri Brunette, MD for the evaluation and management of coronary atherosclerosis.  I have not received any records from Dr. Carolee Rota office. She reports that she is here due to the findings on her CT scan. She is not sure why but she reports that her insurance shows charges from Dr. Eden Emms from December for reasons she is unsure of. I cannot see any reason/record of this.   We reviewed her CT scan 08/07/20. We also did extensive education today about coronary calcium, atherosclerosis, guidelines, statins, diet, and exercise, see below.  Cardiovascular risk factors: Prior clinical ASCVD: none Comorbid conditions: Denies hypertension, hyperlipidemia, diabetes, chronic kidney disease Metabolic syndrome/Obesity: highest adult weight 155 lbs Chronic inflammatory conditions: none Tobacco use history: started 1966, quit around ~2007. About 1 ppd. Alcohol: drinks several beers/night.  Family history: father had unclear heart disease, thinks it was afib as he got periodic cardioversions. None in her sister or brother Prior cardiac testing and/or incidental findings on other testing (ie coronary calcium): coronary and aortic calcium Exercise level: can walk, works full time as a sub in the school system, climbs stairs. Current diet: pasta is a favorite, likes vegetables but not fruit, eats chicken, fish, protein.  Denies chest pain (remote history of pleurisy, none in years), shortness of breath at rest or with normal exertion. No PND, orthopnea, LE edema or unexpected weight gain. No syncope or  palpitations.  Past Medical History:  Diagnosis Date  . Polio     History reviewed. No pertinent surgical history.  Current Medications: Current Outpatient Medications on File Prior to Visit  Medication Sig  . alendronate (FOSAMAX) 70 MG tablet TAKE 1 TABLET BY MOUTH ONCE A WEEK 30  MINUTES  BEFORE  THE  FIRST  FOOD,  BEVERAGE,  OR  MEDICINE  OF  THE  DAY  WITH  PLAIN  WATER  . Ascorbic Acid (VITAMIN C) 1000 MG tablet 1 tablet  . aspirin 81 MG EC tablet 1 tablet  . Calcium Citrate-Vitamin D 315-250 MG-UNIT TABS 2 tablets  . cholecalciferol (VITAMIN D3) 25 MCG (1000 UNIT) tablet 1 capsule  . Estradiol 10 MCG TABS vaginal tablet 1 tablet   No current facility-administered medications on file prior to visit.     Allergies:   Patient has no known allergies.   Social History   Tobacco Use  . Smoking status: Former Smoker    Packs/day: 1.00    Years: 45.00    Pack years: 45.00    Types: Cigarettes    Quit date: 2008    Years since quitting: 14.1  . Smokeless tobacco: Never Used    Family History: Father with afib. No other history of heart disease or stroke.  ROS:   Please see the history of present illness.  Additional pertinent ROS: Constitutional: Negative for chills, fever, night sweats, unintentional weight loss  HENT: Negative for ear pain and hearing loss.   Eyes: Negative for loss of vision and eye pain.  Respiratory: Negative for cough, sputum, wheezing.   Cardiovascular: See HPI. Gastrointestinal: Negative for abdominal pain, melena,  and hematochezia.  Genitourinary: Negative for dysuria and hematuria.  Musculoskeletal: Negative for falls and myalgias.  Skin: Negative for itching and rash.  Neurological: Negative for focal weakness, focal sensory changes and loss of consciousness.  Endo/Heme/Allergies: Does not bruise/bleed easily.     EKGs/Labs/Other Studies Reviewed:    The following studies were reviewed today: CT scan 08/07/20 Cardiovascular: Heart  size is normal. There is no significant pericardial fluid, thickening or pericardial calcification. There is aortic atherosclerosis, as well as atherosclerosis of the great vessels of the mediastinum and the coronary arteries, including calcified atherosclerotic plaque in the left anterior descending and right coronary arteries  EKG:  EKG is personally reviewed.  The ekg ordered today demonstrates NSR   Recent Labs: No results found for requested labs within last 8760 hours.  Recent Lipid Panel No results found for: CHOL, TRIG, HDL, CHOLHDL, VLDL, LDLCALC, LDLDIRECT  Physical Exam:    VS:  BP 136/72   Pulse 64   Ht 5\' 3"  (1.6 m)   Wt 151 lb (68.5 kg)   BMI 26.75 kg/m     Wt Readings from Last 3 Encounters:  08/26/20 151 lb (68.5 kg)    GEN: Well nourished, well developed in no acute distress HEENT: Normal, moist mucous membranes NECK: No JVD CARDIAC: regular rhythm, normal S1 and S2, no rubs or gallops. No murmur. VASCULAR: Radial and DP pulses 2+ bilaterally. No carotid bruits RESPIRATORY:  Clear to auscultation without rales, wheezing or rhonchi  ABDOMEN: Soft, non-tender, non-distended MUSCULOSKELETAL:  Ambulates independently SKIN: Warm and dry, no edema NEUROLOGIC:  Alert and oriented x 3. No focal neuro deficits noted. PSYCHIATRIC:  Normal affect    ASSESSMENT:    1. Coronary artery calcification seen on CT scan   2. Aortic atherosclerosis (HCC)   3. Medication management   4. Cardiac risk counseling   5. Counseling on health promotion and disease prevention   6. Nutritional counseling   7. Exercise counseling    PLAN:    Coronary artery calcification Aortic atherosclerosis Hypercholesterolemia -We reviewed the calcium finding at length, including actual images as well as the graph showing mortality based on calcium score. We discussed the pathophysiology of cholesterol plaque formation, the role of calcium and why it is a marker, how plaque is key to acute  MI/CVA, and how known plaque is managed with medications.  --discussed pathology of cholesterol, how plaques form, that MI/CVA result commonly from acute plaque rupture and not gradual stenosis. Discussed mechanism of statin to both decrease plaque accumulation and stabilize plaque that is already present. Discussed that calcium is a marker for plaque, with decades of validated data regarding average amounts of calcium for age/gender/ethnicity, as well as value of calcium score for risk stratification -we discussed the data on statins, both in terms of their long term benefit as well as the risk of side effects. Reviewed common misconceptions about statins. Reviewed how we monitor treatment. After shared decision making, patient is agreeable to trialing statin. -continue aspirin 81 mg daily. -recheck lipids/LFTs in 2-3 mos Lipids per [facility] 06/26/20: Tchol 231, HDL 128, LDL 95, TG 40 -LDL goal <70 -counseled on red flag warning signs that need immediate medical attention  Cardiac risk counseling and prevention recommendations: -recommend heart healthy/Mediterranean diet, with whole grains, fruits, vegetable, fish, lean meats, nuts, and olive oil. Limit salt. -recommend moderate walking, 3-5 times/week for 30-50 minutes each session. Aim for at least 150 minutes.week. Goal should be pace of 3 miles/hours, or walking 1.5  miles in 30 minutes -recommend avoidance of tobacco products. Avoid excess alcohol.  Plan for follow up: 1 year or sooner PRN  Jodelle RedBridgette Banks Chaikin, MD, PhD, [facility] [place]  [facility]    Medication Adjustments/Labs and Tests Ordered: Current medicines are reviewed at length with the patient today.  Concerns regarding medicines are outlined above.  Orders Placed This Encounter  Procedures  . Lipid panel  . Hepatic function panel  . EKG 12-Lead   Meds ordered this encounter  Medications  . rosuvastatin (CRESTOR) 5 MG tablet    Sig: Take 1 tablet (5 mg total) by mouth  daily.    Dispense:  90 tablet    Refill:  3    Patient Instructions   Medication Instructions:  Start Rosuvastatin 5 mg daily  *If you need a refill on your cardiac medications before your next appointment, please call your pharmacy*   Lab Work: Your physician recommends that you return for lab work in 2-3 month (Fasting Lipid, LFT).  If you have labs (blood work) drawn today and your tests are completely normal, you will receive your results only by: Marland Kitchen. MyChart Message (if you have MyChart) OR . A paper copy in the mail If you have any lab test that is abnormal or we need to change your treatment, we will call you to review the results.   Testing/Procedures: None   Follow-Up: At [facility], you and your health needs are our priority.  As part of our continuing mission to provide you with exceptional heart care, we have created designated Provider Care Teams.  These Care Teams include your primary Cardiologist (physician) and Advanced Practice Providers (APPs -  Physician Assistants and Nurse Practitioners) who all work together to provide you with the care you need, when you need it.  We recommend signing up for the patient portal called "MyChart".  Sign up information is provided on this After Visit Summary.  MyChart is used to connect with patients for Virtual Visits (Telemedicine).  Patients are able to view lab/test results, encounter notes, upcoming appointments, etc.  Non-urgent messages can be sent to your provider as well.   To learn more about what you can do with MyChart, go to [email or web address].    Your next appointment:   1 year(s)  The format for your next appointment:   In Person  Provider:   Jodelle RedBridgette Marinna Blane, MD      Mediterranean Diet A Mediterranean diet refers to food and lifestyle choices that are based on the traditions of countries located on the [place]. This way of eating has been shown to help prevent certain conditions  and improve outcomes for people who have chronic diseases, like kidney disease and heart disease. What are tips for following this plan? Lifestyle  Cook and eat meals together with your family, when possible.  Drink enough fluid to keep your urine clear or pale yellow.  Be physically active every day. This includes: ? Aerobic exercise like running or swimming. ? Leisure activities like gardening, walking, or housework.  Get 7-8 hours of sleep each night.  If recommended by your health care provider, drink red wine in moderation. This means 1 glass a day for nonpregnant women and 2 glasses a day for men. A glass of wine equals 5 oz (150 mL). Reading food labels  Check the serving size of packaged foods. For foods such as rice and pasta, the serving size refers to the amount of cooked product, not dry.  Check the  total fat in packaged foods. Avoid foods that have saturated fat or trans fats.  Check the ingredients list for added sugars, such as corn syrup.   Shopping  At the grocery store, buy most of your food from the areas near the walls of the store. This includes: ? Fresh fruits and vegetables (produce). ? Grains, beans, nuts, and seeds. Some of these may be available in unpackaged forms or large amounts (in bulk). ? Fresh seafood. ? Poultry and eggs. ? Low-fat dairy products.  Buy whole ingredients instead of prepackaged foods.  Buy fresh fruits and vegetables in-season from local farmers markets.  Buy frozen fruits and vegetables in resealable bags.  If you do not have access to quality fresh seafood, buy precooked frozen shrimp or canned fish, such as tuna, salmon, or sardines.  Buy small amounts of raw or cooked vegetables, salads, or olives from the deli or salad bar at your store.  Stock your pantry so you always have certain foods on hand, such as olive oil, canned tuna, canned tomatoes, rice, pasta, and beans. Cooking  Cook foods with extra-virgin olive oil  instead of using butter or other vegetable oils.  Have meat as a side dish, and have vegetables or grains as your main dish. This means having meat in small portions or adding small amounts of meat to foods like pasta or stew.  Use beans or vegetables instead of meat in common dishes like chili or lasagna.  Experiment with different cooking methods. Try roasting or broiling vegetables instead of steaming or sauteing them.  Add frozen vegetables to soups, stews, pasta, or rice.  Add nuts or seeds for added healthy fat at each meal. You can add these to yogurt, salads, or vegetable dishes.  Marinate fish or vegetables using olive oil, lemon juice, garlic, and fresh herbs. Meal planning  Plan to eat 1 vegetarian meal one day each week. Try to work up to 2 vegetarian meals, if possible.  Eat seafood 2 or more times a week.  Have healthy snacks readily available, such as: ? Vegetable sticks with hummus. ? [place] yogurt. ? Fruit and nut trail mix.  Eat balanced meals throughout the week. This includes: ? Fruit: 2-3 servings a day ? Vegetables: 4-5 servings a day ? Low-fat dairy: 2 servings a day ? Fish, poultry, or lean meat: 1 serving a day ? Beans and legumes: 2 or more servings a week ? Nuts and seeds: 1-2 servings a day ? Whole grains: 6-8 servings a day ? Extra-virgin olive oil: 3-4 servings a day  Limit red meat and sweets to only a few servings a month   What are my food choices?  Mediterranean diet ? Recommended  Grains: Whole-grain pasta. Brown rice. Bulgar wheat. Polenta. Couscous. Whole-wheat bread. Orpah Cobb.  Vegetables: Artichokes. Beets. Broccoli. Cabbage. Carrots. Eggplant. Green beans. Chard. Kale. Spinach. Onions. Leeks. Peas. Squash. Tomatoes. Peppers. Radishes.  Fruits: Apples. Apricots. Avocado. Berries. Bananas. Cherries. Dates. Figs. Grapes. Lemons. Melon. Oranges. Peaches. Plums. Pomegranate.  Meats and other protein foods: Beans. Almonds.  Sunflower seeds. Pine nuts. Peanuts. Cod. Salmon. Scallops. Shrimp. Tuna. Tilapia. Clams. Oysters. Eggs.  Dairy: Low-fat milk. Cheese. Greek yogurt.  Beverages: Water. Red wine. Herbal tea.  Fats and oils: Extra virgin olive oil. Avocado oil. Grape seed oil.  Sweets and desserts: [place] yogurt with honey. Baked apples. Poached pears. Trail mix.  Seasoning and other foods: Basil. Cilantro. Coriander. Cumin. Mint. Parsley. Sage. Rosemary. Tarragon. Garlic. Oregano. Thyme. Pepper. Balsalmic vinegar. Tahini.  Hummus. Tomato sauce. Olives. Mushrooms. ? Limit these  Grains: Prepackaged pasta or rice dishes. Prepackaged cereal with added sugar.  Vegetables: Deep fried potatoes (french fries).  Fruits: Fruit canned in syrup.  Meats and other protein foods: Beef. Pork. Lamb. Poultry with skin. Hot dogs. Tomasa Blase.  Dairy: Ice cream. Sour cream. Whole milk.  Beverages: Juice. Sugar-sweetened soft drinks. Beer. Liquor and spirits.  Fats and oils: Butter. Canola oil. Vegetable oil. Beef fat (tallow). Lard.  Sweets and desserts: Cookies. Cakes. Pies. Candy.  Seasoning and other foods: Mayonnaise. Premade sauces and marinades. The items listed may not be a complete list. Talk with your dietitian about what dietary choices are right for you. Summary  The Mediterranean diet includes both food and lifestyle choices.  Eat a variety of fresh fruits and vegetables, beans, nuts, seeds, and whole grains.  Limit the amount of red meat and sweets that you eat.  Talk with your health care provider about whether it is safe for you to drink red wine in moderation. This means 1 glass a day for nonpregnant women and 2 glasses a day for men. A glass of wine equals 5 oz (150 mL). This information is not intended to replace advice given to you by your health care provider. Make sure you discuss any questions you have with your health care provider. Document Revised: 02/27/2016 Document Reviewed:  02/20/2016 Elsevier Patient Education  2020 [employer].    Signed, Jodelle Red, MD PhD 08/26/2020 9:33 AM    [facility]

## 2020-08-28 DIAGNOSIS — Z20822 Contact with and (suspected) exposure to covid-19: Secondary | ICD-10-CM | POA: Diagnosis not present

## 2020-08-28 DIAGNOSIS — Z03818 Encounter for observation for suspected exposure to other biological agents ruled out: Secondary | ICD-10-CM | POA: Diagnosis not present

## 2020-10-29 DIAGNOSIS — Z79899 Other long term (current) drug therapy: Secondary | ICD-10-CM | POA: Diagnosis not present

## 2020-10-29 LAB — LIPID PANEL
Chol/HDL Ratio: 1.7 ratio (ref 0.0–4.4)
Cholesterol, Total: 185 mg/dL (ref 100–199)
HDL: 112 mg/dL (ref >39)
LDL Chol Calc (NIH): 64 mg/dL (ref 0–99)
Triglycerides: 41 mg/dL (ref 0–149)
VLDL Cholesterol Cal: 9 mg/dL (ref 5–40)

## 2020-10-29 LAB — HEPATIC FUNCTION PANEL
ALT: 17 IU/L (ref 0–32)
AST: 17 IU/L (ref 0–40)
Albumin: 4.6 g/dL (ref 3.7–4.7)
Alkaline Phosphatase: 71 IU/L (ref 44–121)
Bilirubin Total: 0.4 mg/dL (ref 0.0–1.2)
Bilirubin, Direct: 0.16 mg/dL (ref 0.00–0.40)
Total Protein: 6.8 g/dL (ref 6.0–8.5)

## 2020-11-13 DIAGNOSIS — M7542 Impingement syndrome of left shoulder: Secondary | ICD-10-CM | POA: Diagnosis not present

## 2020-11-13 DIAGNOSIS — M79621 Pain in right upper arm: Secondary | ICD-10-CM | POA: Diagnosis not present

## 2020-11-18 ENCOUNTER — Encounter: Payer: Self-pay | Admitting: *Deleted

## 2021-02-06 DIAGNOSIS — D3131 Benign neoplasm of right choroid: Secondary | ICD-10-CM | POA: Diagnosis not present

## 2021-02-06 DIAGNOSIS — H53001 Unspecified amblyopia, right eye: Secondary | ICD-10-CM | POA: Diagnosis not present

## 2021-02-06 DIAGNOSIS — Z961 Presence of intraocular lens: Secondary | ICD-10-CM | POA: Diagnosis not present

## 2021-04-02 DIAGNOSIS — M7062 Trochanteric bursitis, left hip: Secondary | ICD-10-CM | POA: Diagnosis not present

## 2021-05-24 ENCOUNTER — Other Ambulatory Visit: Payer: Self-pay | Admitting: Cardiology

## 2021-05-24 DIAGNOSIS — I2510 Atherosclerotic heart disease of native coronary artery without angina pectoris: Secondary | ICD-10-CM

## 2021-05-24 DIAGNOSIS — I700 Atherosclerosis of aorta: Secondary | ICD-10-CM

## 2021-05-26 NOTE — Telephone Encounter (Signed)
Rx(s) sent to pharmacy electronically.  

## 2021-07-10 DIAGNOSIS — E559 Vitamin D deficiency, unspecified: Secondary | ICD-10-CM | POA: Diagnosis not present

## 2021-07-10 DIAGNOSIS — Z0000 Encounter for general adult medical examination without abnormal findings: Secondary | ICD-10-CM | POA: Diagnosis not present

## 2021-07-10 DIAGNOSIS — N390 Urinary tract infection, site not specified: Secondary | ICD-10-CM | POA: Diagnosis not present

## 2021-07-10 DIAGNOSIS — E785 Hyperlipidemia, unspecified: Secondary | ICD-10-CM | POA: Diagnosis not present

## 2021-07-15 DIAGNOSIS — D225 Melanocytic nevi of trunk: Secondary | ICD-10-CM | POA: Diagnosis not present

## 2021-07-15 DIAGNOSIS — L814 Other melanin hyperpigmentation: Secondary | ICD-10-CM | POA: Diagnosis not present

## 2021-07-15 DIAGNOSIS — C44321 Squamous cell carcinoma of skin of nose: Secondary | ICD-10-CM | POA: Diagnosis not present

## 2021-07-15 DIAGNOSIS — L821 Other seborrheic keratosis: Secondary | ICD-10-CM | POA: Diagnosis not present

## 2021-07-15 DIAGNOSIS — I2510 Atherosclerotic heart disease of native coronary artery without angina pectoris: Secondary | ICD-10-CM | POA: Diagnosis not present

## 2021-07-15 DIAGNOSIS — E559 Vitamin D deficiency, unspecified: Secondary | ICD-10-CM | POA: Diagnosis not present

## 2021-07-15 DIAGNOSIS — D485 Neoplasm of uncertain behavior of skin: Secondary | ICD-10-CM | POA: Diagnosis not present

## 2021-07-15 DIAGNOSIS — L578 Other skin changes due to chronic exposure to nonionizing radiation: Secondary | ICD-10-CM | POA: Diagnosis not present

## 2021-07-15 DIAGNOSIS — J439 Emphysema, unspecified: Secondary | ICD-10-CM | POA: Diagnosis not present

## 2021-07-15 DIAGNOSIS — M8589 Other specified disorders of bone density and structure, multiple sites: Secondary | ICD-10-CM | POA: Diagnosis not present

## 2021-07-15 DIAGNOSIS — Z0000 Encounter for general adult medical examination without abnormal findings: Secondary | ICD-10-CM | POA: Diagnosis not present

## 2021-08-05 DIAGNOSIS — M5412 Radiculopathy, cervical region: Secondary | ICD-10-CM | POA: Diagnosis not present

## 2021-08-12 ENCOUNTER — Other Ambulatory Visit: Payer: Self-pay | Admitting: Registered Nurse

## 2021-08-12 DIAGNOSIS — R911 Solitary pulmonary nodule: Secondary | ICD-10-CM

## 2021-08-21 ENCOUNTER — Ambulatory Visit (HOSPITAL_BASED_OUTPATIENT_CLINIC_OR_DEPARTMENT_OTHER): Payer: Medicare Other | Admitting: Cardiology

## 2021-08-27 ENCOUNTER — Ambulatory Visit (HOSPITAL_BASED_OUTPATIENT_CLINIC_OR_DEPARTMENT_OTHER): Payer: Medicare Other | Admitting: Cardiology

## 2021-08-27 ENCOUNTER — Other Ambulatory Visit: Payer: Self-pay

## 2021-08-27 ENCOUNTER — Encounter (HOSPITAL_BASED_OUTPATIENT_CLINIC_OR_DEPARTMENT_OTHER): Payer: Self-pay | Admitting: Cardiology

## 2021-08-27 VITALS — BP 114/64 | HR 63 | Ht 63.0 in | Wt 150.0 lb

## 2021-08-27 DIAGNOSIS — I2510 Atherosclerotic heart disease of native coronary artery without angina pectoris: Secondary | ICD-10-CM | POA: Diagnosis not present

## 2021-08-27 DIAGNOSIS — E7800 Pure hypercholesterolemia, unspecified: Secondary | ICD-10-CM

## 2021-08-27 DIAGNOSIS — R202 Paresthesia of skin: Secondary | ICD-10-CM | POA: Diagnosis not present

## 2021-08-27 DIAGNOSIS — I700 Atherosclerosis of aorta: Secondary | ICD-10-CM | POA: Diagnosis not present

## 2021-08-27 DIAGNOSIS — Z7189 Other specified counseling: Secondary | ICD-10-CM

## 2021-08-27 NOTE — Progress Notes (Signed)
Cardiology Office Note:    Date:  08/27/2021   ID:  Sheryl Arias, DOB [birth year], MRN [id number]  PCP:  Deland Pretty, MD  Cardiologist:  Buford Dresser, MD  Referring MD: Deland Pretty, MD   CC: follow-up   History of Present Illness:    Sheryl Arias is a 76 y.o. female without cardiac history who is seen today for follow-up. She was initially seen 08/26/20 as a new consult at the request of Deland Pretty, MD for the evaluation and management of coronary atherosclerosis.  Today: She is doing well with no major concerns. She reports her blood pressure typically runs on the low end of normal. She is tolerating her medications. Routinely, she takes all of her prescriptions in the morning.   Rarely, she experiences a sharp brief central chest pain. Her blood pressure during these episodes is normal.   She attends physical therapy for arthritis in her neck. If she sleeps in the wrong position, she will wake up with R arm numbness. She had one episode of waking up and feeling burning in her R thumb. She associates this pain to a pinched nerve.  She does not exercise regularly. 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 She is also able to complete housework.  Of note, she used to be a smoker but has quit.  Denies shortness of breath at rest or with normal exertion. No PND, orthopnea, LE edema or unexpected weight gain. No syncope or palpitations.  Past Medical History:  Diagnosis Date   Polio     No past surgical history on file.  Current Medications: Current Outpatient Medications on File Prior to Visit  Medication Sig   alendronate (FOSAMAX) 70 MG tablet TAKE 1 TABLET BY MOUTH ONCE A WEEK 30  MINUTES  BEFORE  THE  FIRST  FOOD,  BEVERAGE,  OR  MEDICINE  OF  THE  DAY  WITH  PLAIN  WATER   Ascorbic Acid (VITAMIN C) 1000 MG tablet 1 tablet   aspirin 81 MG EC tablet 1 tablet   Calcium Citrate-Vitamin D 315-250 MG-UNIT  TABS 2 tablets   cholecalciferol (VITAMIN D3) 25 MCG (1000 UNIT) tablet 1 capsule   Estradiol 10 MCG TABS vaginal tablet 1 tablet   rosuvastatin (CRESTOR) 5 MG tablet Take 1 tablet by mouth once daily   No current facility-administered medications on file prior to visit.     Allergies:   Patient has no known allergies.   Social History   Tobacco Use   Smoking status: Former    Packs/day: 1.00    Years: 45.00    Pack years: 45.00    Types: Cigarettes    Quit date: 2008    Years since quitting: 15.1   Smokeless tobacco: Never    Family History: Father with afib. No other history of heart disease or stroke.  ROS:   Please see the history of present illness.  Additional pertinent ROS: (+) Chest pain  EKGs/Labs/Other Studies Reviewed:    The following studies were reviewed today: CT scan 08/07/20 Cardiovascular: Heart size is normal. There is no significant pericardial fluid, thickening or pericardial calcification. There is aortic atherosclerosis, as well as atherosclerosis of the great vessels of the mediastinum and the coronary arteries, including calcified atherosclerotic plaque in the left anterior descending and right coronary arteries  EKG:  EKG is personally reviewed.   08/27/21: NSR at 63 bpm 08/26/20: NSR   Recent Labs: 10/29/2020: ALT  17  Recent Lipid Panel    Component Value Date/Time   CHOL 185 10/29/2020 0832   TRIG 41 10/29/2020 0832   HDL 112 10/29/2020 0832   CHOLHDL 1.7 10/29/2020 0832   LDLCALC 64 10/29/2020 0832    Physical Exam:    VS:  BP 114/64    Pulse 63    Ht 5\' 3"  (1.6 m)    Wt 150 lb (68 kg)    BMI 26.57 kg/m     Wt Readings from Last 3 Encounters:  08/27/21 150 lb (68 kg)  08/26/20 151 lb (68.5 kg)    GEN: Well nourished, well developed in no acute distress HEENT: Normal, moist mucous membranes NECK: No JVD CARDIAC: regular rhythm, normal S1 and S2, no rubs or gallops. No murmur. VASCULAR: Radial and DP pulses 2+ bilaterally.  No carotid bruits RESPIRATORY:  Clear to auscultation without rales, wheezing or rhonchi  ABDOMEN: Soft, non-tender, non-distended MUSCULOSKELETAL:  Ambulates independently SKIN: Warm and dry, no edema NEUROLOGIC:  Alert and oriented x 3. No focal neuro deficits noted. PSYCHIATRIC:  Normal affect    ASSESSMENT:    1. Aortic atherosclerosis ([place])   2. Coronary artery calcification seen on CT scan   3. Cardiac risk counseling   4. Counseling on health promotion and disease prevention   5. Pure hypercholesterolemia     PLAN:    Coronary artery calcification Aortic atherosclerosis Hypercholesterolemia -continue aspirin 81 mg daily. Lipids per [facility]  10/29/20: Tchol 185, HDL 112, LDL 64, TG 41 06/26/20: Tchol 231, HDL 128, LDL 95, TG 40 -LDL goal <70 -counseled on red flag warning signs that need immediate medical attention -ok to take rosuvastatin with her other medications  Labs from 07-15-21 reviewed from [facility]: Tchol 199, HDL 111, LDL 78, TG 48 TSH 1.82 CBC normal CMET unremarkable, Cr 0.83,  4.6  Cardiac risk counseling and prevention recommendations: -recommend heart healthy/Mediterranean diet, with whole grains, fruits, vegetable, fish, lean meats, nuts, and olive oil. Limit salt. -recommend moderate walking, 3-5 times/week for 30-50 minutes each session. Aim for at least 150 minutes.week. Goal should be pace of 3 miles/hours, or walking 1.5 miles in 30 minutes -recommend avoidance of tobacco products. Avoid excess alcohol.  Plan for follow up: 2 years  Buford Dresser, MD, PhD, [facility]    Medication Adjustments/Labs and Tests Ordered: Current medicines are reviewed at length with the patient today.  Concerns regarding medicines are outlined above.  Orders Placed This Encounter  Procedures   EKG 12-Lead   No orders of the defined types were placed in this encounter.   Patient Instructions  Medication Instructions:   Your Physician recommend you continue on your current medication as directed.    *If you need a refill on your cardiac medications before your next appointment, please call your pharmacy*   Lab Work: None ordered today  Testing/Procedures: None ordered today    Follow-Up: At [facility], you and your health needs are our priority.  As part of our continuing mission to provide you with exceptional heart care, we have created designated Provider Care Teams.  These Care Teams include your primary Cardiologist (physician) and Advanced Practice Providers (APPs -  Physician Assistants and Nurse Practitioners) who all work together to provide you with the care you need, when you need it.  We recommend signing up for the patient portal called "MyChart".  Sign up information is provided on this After Visit Summary.  MyChart is used  to connect with patients for Virtual Visits (Telemedicine).  Patients are able to view lab/test results, encounter notes, upcoming appointments, etc.  Non-urgent messages can be sent to your provider as well.   To learn more about what you can do with MyChart, go to [email or web address].    Your next appointment:   2 year(s)  The format for your next appointment:   In Person  Provider:   Buford Dresser, MD{    Wilhemina Bonito as a scribe for Buford Dresser, MD.,have documented all relevant documentation on the behalf of Buford Dresser, MD,as directed by  Buford Dresser, MD while in the presence of Buford Dresser, MD.  I, Buford Dresser, MD, have reviewed all documentation for this visit. The documentation on 08/27/21 for the exam, diagnosis, procedures, and orders are all accurate and complete.   Signed, Buford Dresser, MD PhD 08/27/2021 1:52 PM    [facility]

## 2021-08-27 NOTE — Patient Instructions (Signed)

## 2021-08-29 DIAGNOSIS — Z1231 Encounter for screening mammogram for malignant neoplasm of breast: Secondary | ICD-10-CM | POA: Diagnosis not present

## 2021-08-29 DIAGNOSIS — Z6826 Body mass index (BMI) 26.0-26.9, adult: Secondary | ICD-10-CM | POA: Diagnosis not present

## 2021-08-29 DIAGNOSIS — Z01419 Encounter for gynecological examination (general) (routine) without abnormal findings: Secondary | ICD-10-CM | POA: Diagnosis not present

## 2021-09-05 ENCOUNTER — Ambulatory Visit
Admission: RE | Admit: 2021-09-05 | Discharge: 2021-09-05 | Disposition: A | Discharge status: Home / Self Care | Payer: Medicare Other | Source: Ambulatory Visit | Attending: Registered Nurse | Admitting: Registered Nurse

## 2021-09-05 ENCOUNTER — Other Ambulatory Visit: Payer: Self-pay

## 2021-09-05 DIAGNOSIS — R911 Solitary pulmonary nodule: Secondary | ICD-10-CM | POA: Diagnosis not present

## 2021-09-05 DIAGNOSIS — J439 Emphysema, unspecified: Secondary | ICD-10-CM | POA: Diagnosis not present

## 2021-09-12 DIAGNOSIS — R202 Paresthesia of skin: Secondary | ICD-10-CM | POA: Diagnosis not present

## 2021-09-25 DIAGNOSIS — C44321 Squamous cell carcinoma of skin of nose: Secondary | ICD-10-CM | POA: Diagnosis not present

## 2021-09-26 DIAGNOSIS — R202 Paresthesia of skin: Secondary | ICD-10-CM | POA: Diagnosis not present

## 2021-10-02 DIAGNOSIS — M542 Cervicalgia: Secondary | ICD-10-CM | POA: Diagnosis not present

## 2021-10-02 DIAGNOSIS — M25512 Pain in left shoulder: Secondary | ICD-10-CM | POA: Diagnosis not present

## 2021-10-14 DIAGNOSIS — M25512 Pain in left shoulder: Secondary | ICD-10-CM | POA: Diagnosis not present

## 2021-12-30 DIAGNOSIS — L570 Actinic keratosis: Secondary | ICD-10-CM | POA: Diagnosis not present

## 2021-12-30 DIAGNOSIS — D0439 Carcinoma in situ of skin of other parts of face: Secondary | ICD-10-CM | POA: Diagnosis not present

## 2021-12-30 DIAGNOSIS — T1490XD Injury, unspecified, subsequent encounter: Secondary | ICD-10-CM | POA: Diagnosis not present

## 2021-12-30 DIAGNOSIS — D485 Neoplasm of uncertain behavior of skin: Secondary | ICD-10-CM | POA: Diagnosis not present

## 2022-02-09 DIAGNOSIS — Z961 Presence of intraocular lens: Secondary | ICD-10-CM | POA: Diagnosis not present

## 2022-02-09 DIAGNOSIS — D3131 Benign neoplasm of right choroid: Secondary | ICD-10-CM | POA: Diagnosis not present

## 2022-02-10 DIAGNOSIS — Z6826 Body mass index (BMI) 26.0-26.9, adult: Secondary | ICD-10-CM | POA: Diagnosis not present

## 2022-02-10 DIAGNOSIS — J069 Acute upper respiratory infection, unspecified: Secondary | ICD-10-CM | POA: Diagnosis not present

## 2022-02-10 DIAGNOSIS — R051 Acute cough: Secondary | ICD-10-CM | POA: Diagnosis not present

## 2022-02-14 DIAGNOSIS — J019 Acute sinusitis, unspecified: Secondary | ICD-10-CM | POA: Diagnosis not present

## 2022-02-14 DIAGNOSIS — H1032 Unspecified acute conjunctivitis, left eye: Secondary | ICD-10-CM | POA: Diagnosis not present

## 2022-02-20 ENCOUNTER — Other Ambulatory Visit: Payer: Self-pay | Admitting: Cardiology

## 2022-02-20 DIAGNOSIS — I2510 Atherosclerotic heart disease of native coronary artery without angina pectoris: Secondary | ICD-10-CM

## 2022-02-20 DIAGNOSIS — I700 Atherosclerosis of aorta: Secondary | ICD-10-CM

## 2022-02-27 DIAGNOSIS — D099 Carcinoma in situ, unspecified: Secondary | ICD-10-CM | POA: Diagnosis not present

## 2022-03-04 DIAGNOSIS — M25512 Pain in left shoulder: Secondary | ICD-10-CM | POA: Diagnosis not present

## 2022-03-07 DIAGNOSIS — M25512 Pain in left shoulder: Secondary | ICD-10-CM | POA: Diagnosis not present

## 2022-03-25 DIAGNOSIS — M25512 Pain in left shoulder: Secondary | ICD-10-CM | POA: Diagnosis not present

## 2022-03-25 DIAGNOSIS — M19112 Post-traumatic osteoarthritis, left shoulder: Secondary | ICD-10-CM | POA: Diagnosis not present

## 2022-03-25 DIAGNOSIS — M12819 Other specific arthropathies, not elsewhere classified, unspecified shoulder: Secondary | ICD-10-CM | POA: Insufficient documentation

## 2022-04-01 DIAGNOSIS — L821 Other seborrheic keratosis: Secondary | ICD-10-CM | POA: Diagnosis not present

## 2022-04-01 DIAGNOSIS — D099 Carcinoma in situ, unspecified: Secondary | ICD-10-CM | POA: Diagnosis not present

## 2022-05-05 DIAGNOSIS — N958 Other specified menopausal and perimenopausal disorders: Secondary | ICD-10-CM | POA: Diagnosis not present

## 2022-05-05 DIAGNOSIS — M8588 Other specified disorders of bone density and structure, other site: Secondary | ICD-10-CM | POA: Diagnosis not present

## 2022-05-07 DIAGNOSIS — L821 Other seborrheic keratosis: Secondary | ICD-10-CM | POA: Diagnosis not present

## 2022-07-02 DIAGNOSIS — D23111 Other benign neoplasm of skin of right upper eyelid, including canthus: Secondary | ICD-10-CM | POA: Diagnosis not present

## 2022-07-22 DIAGNOSIS — D225 Melanocytic nevi of trunk: Secondary | ICD-10-CM | POA: Diagnosis not present

## 2022-07-22 DIAGNOSIS — L578 Other skin changes due to chronic exposure to nonionizing radiation: Secondary | ICD-10-CM | POA: Diagnosis not present

## 2022-07-22 DIAGNOSIS — L814 Other melanin hyperpigmentation: Secondary | ICD-10-CM | POA: Diagnosis not present

## 2022-07-22 DIAGNOSIS — L821 Other seborrheic keratosis: Secondary | ICD-10-CM | POA: Diagnosis not present

## 2022-07-30 DIAGNOSIS — Z1211 Encounter for screening for malignant neoplasm of colon: Secondary | ICD-10-CM | POA: Diagnosis not present

## 2022-07-30 DIAGNOSIS — K5730 Diverticulosis of large intestine without perforation or abscess without bleeding: Secondary | ICD-10-CM | POA: Diagnosis not present

## 2022-08-05 DIAGNOSIS — N182 Chronic kidney disease, stage 2 (mild): Secondary | ICD-10-CM | POA: Diagnosis not present

## 2022-08-05 DIAGNOSIS — Z0000 Encounter for general adult medical examination without abnormal findings: Secondary | ICD-10-CM | POA: Diagnosis not present

## 2022-08-05 DIAGNOSIS — E785 Hyperlipidemia, unspecified: Secondary | ICD-10-CM | POA: Diagnosis not present

## 2022-08-05 DIAGNOSIS — N390 Urinary tract infection, site not specified: Secondary | ICD-10-CM | POA: Diagnosis not present

## 2022-08-05 DIAGNOSIS — E559 Vitamin D deficiency, unspecified: Secondary | ICD-10-CM | POA: Diagnosis not present

## 2022-08-12 DIAGNOSIS — E785 Hyperlipidemia, unspecified: Secondary | ICD-10-CM | POA: Diagnosis not present

## 2022-08-12 DIAGNOSIS — M810 Age-related osteoporosis without current pathological fracture: Secondary | ICD-10-CM | POA: Diagnosis not present

## 2022-08-12 DIAGNOSIS — I700 Atherosclerosis of aorta: Secondary | ICD-10-CM | POA: Diagnosis not present

## 2022-08-12 DIAGNOSIS — Z0000 Encounter for general adult medical examination without abnormal findings: Secondary | ICD-10-CM | POA: Diagnosis not present

## 2022-08-12 DIAGNOSIS — I2510 Atherosclerotic heart disease of native coronary artery without angina pectoris: Secondary | ICD-10-CM | POA: Diagnosis not present

## 2022-08-12 DIAGNOSIS — M25512 Pain in left shoulder: Secondary | ICD-10-CM | POA: Diagnosis not present

## 2022-08-17 DIAGNOSIS — Z1211 Encounter for screening for malignant neoplasm of colon: Secondary | ICD-10-CM | POA: Diagnosis not present

## 2022-08-17 DIAGNOSIS — Z1212 Encounter for screening for malignant neoplasm of rectum: Secondary | ICD-10-CM | POA: Diagnosis not present

## 2022-08-27 ENCOUNTER — Other Ambulatory Visit: Payer: Self-pay | Admitting: Cardiology

## 2022-08-27 DIAGNOSIS — I2510 Atherosclerotic heart disease of native coronary artery without angina pectoris: Secondary | ICD-10-CM

## 2022-08-27 DIAGNOSIS — I700 Atherosclerosis of aorta: Secondary | ICD-10-CM

## 2022-08-28 NOTE — Telephone Encounter (Signed)
Rx(s) sent to pharmacy electronically.  

## 2022-09-02 DIAGNOSIS — Z1231 Encounter for screening mammogram for malignant neoplasm of breast: Secondary | ICD-10-CM | POA: Diagnosis not present

## 2022-09-02 DIAGNOSIS — Z01419 Encounter for gynecological examination (general) (routine) without abnormal findings: Secondary | ICD-10-CM | POA: Diagnosis not present

## 2022-09-02 DIAGNOSIS — Z6825 Body mass index (BMI) 25.0-25.9, adult: Secondary | ICD-10-CM | POA: Diagnosis not present

## 2022-09-02 DIAGNOSIS — E785 Hyperlipidemia, unspecified: Secondary | ICD-10-CM | POA: Insufficient documentation

## 2022-09-09 ENCOUNTER — Encounter: Payer: Self-pay | Admitting: Pulmonary Disease

## 2022-09-09 ENCOUNTER — Encounter (HOSPITAL_BASED_OUTPATIENT_CLINIC_OR_DEPARTMENT_OTHER): Payer: Self-pay

## 2022-09-09 ENCOUNTER — Ambulatory Visit: Payer: Medicare Other | Admitting: Pulmonary Disease

## 2022-09-09 VITALS — BP 136/70 | HR 60 | Ht 63.0 in | Wt 145.0 lb

## 2022-09-09 DIAGNOSIS — J432 Centrilobular emphysema: Secondary | ICD-10-CM

## 2022-09-09 DIAGNOSIS — Z87891 Personal history of nicotine dependence: Secondary | ICD-10-CM

## 2022-09-09 MED ORDER — ALBUTEROL SULFATE HFA 108 (90 BASE) MCG/ACT IN AERS: 2.0000 | INHALATION_SPRAY | Freq: Four times a day (QID) | RESPIRATORY_TRACT | 6 refills | Status: DC | PRN

## 2022-09-09 NOTE — Patient Instructions (Addendum)
Try albuterol inhaler 1-2 puffs every 4-6 hours as needed. You can also try before going on a walk to see if it helps with shortness of breath.  Follow up in 3 months with pulmonary function tests

## 2022-09-09 NOTE — Progress Notes (Signed)
Synopsis: Referred in February 2024 for emphysema by Deland Pretty, MD  Subjective:   PATIENT ID: Sheryl Arias GENDER: female DOB: August 21, 1945, MRN: [id number]  HPI  Chief Complaint  Patient presents with   Consult    Referred by PCP for emphysema. States she only has SOB with exertion or when having to hurry. Denies any coughing or wheezing.    Sheryl Arias is a 77 year old woman, former smoker who is referred to pulmonary clinic for emphysema.   She has been enrolled in lung cancer screening program and LCS CT Chests have shown mild centrilobular emphysematous changes.   She has exertional shortness of breath when walking inclines or if she is in a hurry/rush. She denies wheezing, cough or sputum production. She has no night time awakenings due to cough, dyspnea or wheezing. She denies seasonal allergies. No issues with reflux.   She has 40 pack year history. She had second hand smoke exposure from parents in childhood. 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 No exposure to embalming fluids. No pets. Her sister has emphysema and is on oxygen.  Past Medical History:  Diagnosis Date   Polio      No family history on file.   Social History   Socioeconomic History   Marital status: Widowed    Spouse name: Not on file   Number of children: Not on file   Years of education: Not on file   Highest education level: Not on file  Occupational History   Not on file  Tobacco Use   Smoking status: Former    Packs/day: 1.00    Years: 45.00    Total pack years: 45.00    Types: Cigarettes    Quit date: 2008    Years since quitting: 16.1   Smokeless tobacco: Never  Substance and Sexual Activity   Alcohol use: Not on file   Drug use: Not on file   Sexual activity: Not on file  Other Topics Concern   Not on file  Social History Narrative   Not on file   Social  Determinants of Health   Financial Resource Strain: Not on file  Food Insecurity: Not on file  Transportation Needs: Not on file  Physical Activity: Not on file  Stress: Not on file  Social Connections: Not on file  Intimate Partner Violence: Not on file     No Known Allergies   Outpatient Medications Prior to Visit  Medication Sig Dispense Refill   alendronate (FOSAMAX) 70 MG tablet TAKE 1 TABLET BY MOUTH ONCE A WEEK 30  MINUTES  BEFORE  THE  FIRST  FOOD,  BEVERAGE,  OR  MEDICINE  OF  THE  DAY  WITH  PLAIN  WATER     Ascorbic Acid (VITAMIN C) 1000 MG tablet 1 tablet     aspirin 81 MG EC tablet 1 tablet     Calcium Citrate-Vitamin D 315-250 MG-UNIT TABS 2 tablets     cholecalciferol (VITAMIN D3) 25 MCG (1000 UNIT) tablet 1 capsule     Estradiol 10 MCG TABS vaginal tablet 1 tablet     rosuvastatin (CRESTOR) 5 MG tablet Take 1 tablet (5 mg total) by mouth daily. NEED APPOINTMENT 30 tablet 0   No facility-administered medications prior to visit.    Review of Systems  Constitutional:  Negative for chills, fever, malaise/fatigue and weight loss.  HENT:  Negative for congestion, sinus pain and sore throat.   Eyes: Negative.   Respiratory:  Positive for shortness of breath (with exertion). Negative for cough, hemoptysis, sputum production and wheezing.   Cardiovascular:  Negative for chest pain, palpitations, orthopnea, claudication and leg swelling.  Gastrointestinal:  Negative for abdominal pain, heartburn, nausea and vomiting.  Genitourinary: Negative.   Musculoskeletal:  Negative for joint pain and myalgias.  Skin:  Negative for rash.  Neurological:  Negative for weakness.  Endo/Heme/Allergies: Negative.   Psychiatric/Behavioral: Negative.     Objective:   Vitals:   09/09/22 0829  BP: 136/70  Pulse: 60  SpO2: 98%  Weight: 145 lb (65.8 kg)  Height: '5\' 3"'$  (1.6 m)   Physical Exam Constitutional:      General: She is not in acute distress.    Appearance: She is not  ill-appearing.  HENT:     Head: Normocephalic and atraumatic.  Eyes:     General: No scleral icterus.    Conjunctiva/sclera: Conjunctivae normal.     Pupils: Pupils are equal, round, and reactive to light.  Cardiovascular:     Rate and Rhythm: Normal rate and regular rhythm.     Pulses: Normal pulses.     Heart sounds: Normal heart sounds. No murmur heard. Pulmonary:     Effort: Pulmonary effort is normal.     Breath sounds: Normal breath sounds. No wheezing, rhonchi or rales.  Abdominal:     General: Bowel sounds are normal.     Palpations: Abdomen is soft.  Musculoskeletal:     Right lower leg: No edema.     Left lower leg: No edema.  Lymphadenopathy:     Cervical: No cervical adenopathy.  Skin:    General: Skin is warm and dry.  Neurological:     General: No focal deficit present.     Mental Status: She is alert.  Psychiatric:        Mood and Affect: Mood normal.        Behavior: Behavior normal.        Thought Content: Thought content normal.        Judgment: Judgment normal.       CBC No results found for: "WBC", "RBC", "HGB", "HCT", "PLT", "MCV", "MCH", "MCHC", "RDW", "LYMPHSABS", "MONOABS", "EOSABS", "BASOSABS"   Chest imaging: CT Chest 09/05/21 No indeterminate or suspicious focal pulmonary nodules identified. Dominant nodule within the left apex represents benign calcified granuloma.   Mild centrilobular emphysema.   Mild coronary artery calcification   Aortic Atherosclerosis (ICD10-I70.0) and Emphysema   PFT:     No data to display          Labs:  Path:  Echo:  Heart Catheterization:  Assessment & Plan:   Centrilobular emphysema (Sheryl Arias) - Plan: Pulmonary Function Test, albuterol (VENTOLIN HFA) 108 (90 Base) MCG/ACT inhaler  Former cigarette smoker - Plan: Ambulatory Referral for Lung Cancer Scre  Discussion: Sheryl Arias is a 77 year old woman, former smoker who is referred to pulmonary clinic for emphysema.   She has mild  centrilobular emphysema on CT Chest scans.   We will get her re-enrolled in the lung cancer screening program, she is due for CT chest scan.   She is to try albuterol inhaler 1-2 puffs every 4-6 hours as needed. She is overall very functional and experiences minimal episodes of dyspnea.   Follow up in 3 months with pulmonary function tests.   Sheryl Jackson, MD [facility] Office: [phone]  Current Outpatient Medications:    albuterol (VENTOLIN HFA) 108 (90 Base) MCG/ACT inhaler, Inhale 2 puffs into the lungs every 6 (six) hours as needed for wheezing or shortness of breath., Disp: 8 g, Rfl: 6   alendronate (FOSAMAX) 70 MG tablet, TAKE 1 TABLET BY MOUTH ONCE A WEEK 30  MINUTES  BEFORE  THE  FIRST  FOOD,  BEVERAGE,  OR  MEDICINE  OF  THE  DAY  WITH  PLAIN  WATER, Disp: , Rfl:    Ascorbic Acid (VITAMIN C) 1000 MG tablet, 1 tablet, Disp: , Rfl:    aspirin 81 MG EC tablet, 1 tablet, Disp: , Rfl:    Calcium Citrate-Vitamin D 315-250 MG-UNIT TABS, 2 tablets, Disp: , Rfl:    cholecalciferol (VITAMIN D3) 25 MCG (1000 UNIT) tablet, 1 capsule, Disp: , Rfl:    Estradiol 10 MCG TABS vaginal tablet, 1 tablet, Disp: , Rfl:    rosuvastatin (CRESTOR) 5 MG tablet, Take 1 tablet (5 mg total) by mouth daily. NEED APPOINTMENT, Disp: 30 tablet, Rfl: 0

## 2022-09-26 ENCOUNTER — Other Ambulatory Visit: Payer: Self-pay | Admitting: Cardiology

## 2022-09-26 DIAGNOSIS — I2510 Atherosclerotic heart disease of native coronary artery without angina pectoris: Secondary | ICD-10-CM

## 2022-09-26 DIAGNOSIS — I700 Atherosclerosis of aorta: Secondary | ICD-10-CM

## 2022-09-28 NOTE — Telephone Encounter (Signed)
Rx(s) sent to pharmacy electronically.  

## 2022-09-30 DIAGNOSIS — K080 Exfoliation of teeth due to systemic causes: Secondary | ICD-10-CM | POA: Diagnosis not present

## 2022-10-01 DIAGNOSIS — M7062 Trochanteric bursitis, left hip: Secondary | ICD-10-CM | POA: Diagnosis not present

## 2022-10-08 ENCOUNTER — Telehealth: Payer: Self-pay | Admitting: Pulmonary Disease

## 2022-10-08 DIAGNOSIS — J432 Centrilobular emphysema: Secondary | ICD-10-CM

## 2022-10-08 DIAGNOSIS — Z87891 Personal history of nicotine dependence: Secondary | ICD-10-CM

## 2022-10-08 NOTE — Telephone Encounter (Signed)
Spoke with pt regarding follow up lung cancer screening CT. Our records show that pt quit smoking in 2008. Pt states that this is correct the best she can remember. I explained to pt that lung screening guidelines no longer approve once pt has quit smoking over 15 yrs. I explained to pt that I would send this message to Dr Erin Fulling to see if he would like to order a regular Chest Ct w/o contrast instead. Pt verbalized understanding.  Dr Erin Fulling, please advise.

## 2022-10-08 NOTE — Telephone Encounter (Signed)
I have ordered non-contrast CT Chest for further evaluation.  Thanks, JD

## 2022-10-19 ENCOUNTER — Telehealth: Payer: Self-pay | Admitting: Cardiology

## 2022-10-19 DIAGNOSIS — I2510 Atherosclerotic heart disease of native coronary artery without angina pectoris: Secondary | ICD-10-CM

## 2022-10-19 DIAGNOSIS — I700 Atherosclerosis of aorta: Secondary | ICD-10-CM

## 2022-10-19 MED ORDER — ROSUVASTATIN CALCIUM 5 MG PO TABS: 5.0000 mg | ORAL_TABLET | Freq: Every day | ORAL | 3 refills | Status: DC

## 2022-10-19 NOTE — Telephone Encounter (Signed)
Rx request sent to pharmacy.  

## 2022-10-19 NOTE — Telephone Encounter (Signed)
*  STAT* If patient is at the pharmacy, call can be transferred to refill team.   1. Which medications need to be refilled? (please list name of each medication and dose if known) rosuvastatin (CRESTOR) 5 MG tablet   2. Which pharmacy/location (including street and city if local pharmacy) is medication to be sent to?TAKE 1 TABLET BY MOUTH ONCE DAILY (NEED APPOINTMENT FOR FURTHER REFILLS)   3. Do they need a 30 day or 90 day supply? 90 Day Supply  Pt only has 2 tablets left, and has an appt scheduled for 12/21/22

## 2022-11-09 ENCOUNTER — Ambulatory Visit
Admission: RE | Admit: 2022-11-09 | Discharge: 2022-11-09 | Disposition: A | Discharge status: Home / Self Care | Payer: Medicare Other | Source: Ambulatory Visit | Attending: Pulmonary Disease | Admitting: Pulmonary Disease

## 2022-11-09 DIAGNOSIS — Z87891 Personal history of nicotine dependence: Secondary | ICD-10-CM

## 2022-11-09 DIAGNOSIS — R0602 Shortness of breath: Secondary | ICD-10-CM | POA: Diagnosis not present

## 2022-11-09 DIAGNOSIS — Z85828 Personal history of other malignant neoplasm of skin: Secondary | ICD-10-CM | POA: Diagnosis not present

## 2022-11-09 DIAGNOSIS — I700 Atherosclerosis of aorta: Secondary | ICD-10-CM | POA: Diagnosis not present

## 2022-11-09 DIAGNOSIS — J432 Centrilobular emphysema: Secondary | ICD-10-CM

## 2022-11-23 DIAGNOSIS — R200 Anesthesia of skin: Secondary | ICD-10-CM | POA: Diagnosis not present

## 2022-11-25 NOTE — Progress Notes (Signed)
There is evidence of emphysema on the CT Chest scan. No other findings for shortness of breath.

## 2022-12-02 DIAGNOSIS — G5601 Carpal tunnel syndrome, right upper limb: Secondary | ICD-10-CM | POA: Diagnosis not present

## 2022-12-15 ENCOUNTER — Ambulatory Visit: Payer: Medicare Other | Admitting: Pulmonary Disease

## 2022-12-15 ENCOUNTER — Ambulatory Visit (INDEPENDENT_AMBULATORY_CARE_PROVIDER_SITE_OTHER): Payer: Medicare Other | Admitting: Pulmonary Disease

## 2022-12-15 ENCOUNTER — Encounter: Payer: Self-pay | Admitting: Pulmonary Disease

## 2022-12-15 VITALS — BP 128/64 | HR 55 | Ht 63.0 in | Wt 148.4 lb

## 2022-12-15 DIAGNOSIS — J432 Centrilobular emphysema: Secondary | ICD-10-CM

## 2022-12-15 LAB — PULMONARY FUNCTION TEST
DL/VA % pred: 85 %
DL/VA: 3.54 ml/min/mmHg/L
DLCO cor % pred: 86 %
DLCO cor: 16.11 ml/min/mmHg
DLCO unc % pred: 86 %
DLCO unc: 16.11 ml/min/mmHg
FEF 25-75 Post: 1.68 L/sec
FEF 25-75 Pre: 1.59 L/sec
FEF2575-%Change-Post: 5 %
FEF2575-%Pred-Post: 101 %
FEF2575-%Pred-Pre: 96 %
FEV1-%Change-Post: 1 %
FEV1-%Pred-Post: 94 %
FEV1-%Pred-Pre: 92 %
FEV1-Post: 1.94 L
FEV1-Pre: 1.9 L
FEV1FVC-%Change-Post: 0 %
FEV1FVC-%Pred-Pre: 101 %
FEV6-%Change-Post: 1 %
FEV6-%Pred-Post: 97 %
FEV6-%Pred-Pre: 96 %
FEV6-Post: 2.52 L
FEV6-Pre: 2.49 L
FEV6FVC-%Change-Post: 0 %
FEV6FVC-%Pred-Post: 104 %
FEV6FVC-%Pred-Pre: 104 %
FVC-%Change-Post: 1 %
FVC-%Pred-Post: 92 %
FVC-%Pred-Pre: 91 %
FVC-Post: 2.54 L
FVC-Pre: 2.5 L
Post FEV1/FVC ratio: 76 %
Post FEV6/FVC ratio: 99 %
Pre FEV1/FVC ratio: 76 %
Pre FEV6/FVC Ratio: 100 %
RV % pred: 92 %
RV: 2.05 L
TLC % pred: 99 %
TLC: 4.89 L

## 2022-12-15 NOTE — Patient Instructions (Signed)
Full PFT performed today. °

## 2022-12-15 NOTE — Progress Notes (Signed)
Full PFT performed today. °

## 2022-12-15 NOTE — Progress Notes (Signed)
Synopsis: Referred in February 2024 for emphysema by Merri Brunette, MD  Subjective:   PATIENT ID: Sheryl Arias GENDER: female DOB: 04-06-1946, MRN: [id number]  HPI  Chief Complaint  Patient presents with   Follow-up    F/U after PFT. States her breathing has stable since last visit. Only uses albuterol as needed.    Sheryl Arias is a 77 year old woman, former smoker who is referred to pulmonary clinic for emphysema.   Initial OV 09/09/22 She has been enrolled in lung cancer screening program and LCS CT Chests have shown mild centrilobular emphysematous changes.   She has exertional shortness of breath when walking inclines or if she is in a hurry/rush. She denies wheezing, cough or sputum production. She has no night time awakenings due to cough, dyspnea or wheezing. She denies seasonal allergies. No issues with reflux.   She has 40 pack year history. She had second hand smoke exposure from parents in childhood. 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 No exposure to embalming fluids. No pets. Her sister has emphysema and is on oxygen.  Today - 12/15/22 She denies any issues since last visit. She has been using albuterol as needed with some relief.  PFTs are within normal limits today.   Past Medical History:  Diagnosis Date   Polio      History reviewed. No pertinent family history.   Social History   Socioeconomic History   Marital status: Widowed    Spouse name: Not on file   Number of children: Not on file   Years of education: Not on file   Highest education level: Not on file  Occupational History   Not on file  Tobacco Use   Smoking status: Former    Packs/day: 1.00    Years: 45.00    Additional pack years: 0.00    Total pack years: 45.00    Types: Cigarettes    Quit date: 2008    Years since quitting: 16.4   Smokeless tobacco: Never  Substance  and Sexual Activity   Alcohol use: Not on file   Drug use: Not on file   Sexual activity: Not on file  Other Topics Concern   Not on file  Social History Narrative   Not on file   Social Determinants of Health   Financial Resource Strain: Not on file  Food Insecurity: Not on file  Transportation Needs: Not on file  Physical Activity: Not on file  Stress: Not on file  Social Connections: Not on file  Intimate Partner Violence: Not on file     No Known Allergies   Outpatient Medications Prior to Visit  Medication Sig Dispense Refill   albuterol (VENTOLIN HFA) 108 (90 Base) MCG/ACT inhaler Inhale 2 puffs into the lungs every 6 (six) hours as needed for wheezing or shortness of breath. 8 g 6   alendronate (FOSAMAX) 70 MG tablet TAKE 1 TABLET BY MOUTH ONCE A WEEK 30  MINUTES  BEFORE  THE  FIRST  FOOD,  BEVERAGE,  OR  MEDICINE  OF  THE  DAY  WITH  PLAIN  WATER     Ascorbic Acid (VITAMIN C) 1000 MG tablet 1 tablet     aspirin 81 MG EC tablet 1 tablet     Calcium Citrate-Vitamin D 315-250 MG-UNIT TABS 2 tablets     cholecalciferol (VITAMIN D3) 25 MCG (1000 UNIT)  tablet Take 3,000 Units by mouth daily.     Estradiol 10 MCG TABS vaginal tablet 1 tablet     rosuvastatin (CRESTOR) 5 MG tablet Take 1 tablet (5 mg total) by mouth daily. 90 tablet 3   No facility-administered medications prior to visit.    Review of Systems  Constitutional:  Negative for chills, fever, malaise/fatigue and weight loss.  HENT:  Negative for congestion, sinus pain and sore throat.   Eyes: Negative.   Respiratory:  Positive for shortness of breath (with exertion). Negative for cough, hemoptysis, sputum production and wheezing.   Cardiovascular:  Negative for chest pain, palpitations, orthopnea, claudication and leg swelling.  Gastrointestinal:  Negative for abdominal pain, heartburn, nausea and vomiting.  Genitourinary: Negative.   Musculoskeletal:  Negative for joint pain and myalgias.  Skin:  Negative for  rash.  Neurological:  Negative for weakness.  Endo/Heme/Allergies: Negative.   Psychiatric/Behavioral: Negative.     Objective:   Vitals:   12/15/22 1105  BP: 128/64  Pulse: (!) 55  SpO2: 96%  Weight: 148 lb 6.4 oz (67.3 kg)  Height: 5\' 3"  (1.6 m)    Physical Exam Constitutional:      General: She is not in acute distress.    Appearance: She is not ill-appearing.  HENT:     Head: Normocephalic and atraumatic.  Eyes:     General: No scleral icterus.    Conjunctiva/sclera: Conjunctivae normal.  Cardiovascular:     Rate and Rhythm: Normal rate and regular rhythm.     Pulses: Normal pulses.     Heart sounds: Normal heart sounds. No murmur heard. Pulmonary:     Effort: Pulmonary effort is normal.     Breath sounds: Normal breath sounds. No wheezing, rhonchi or rales.  Musculoskeletal:     Right lower leg: No edema.     Left lower leg: No edema.  Skin:    General: Skin is warm and dry.  Neurological:     General: No focal deficit present.     Mental Status: She is alert.     CBC No results found for: "WBC", "RBC", "HGB", "HCT", "PLT", "MCV", "MCH", "MCHC", "RDW", "LYMPHSABS", "MONOABS", "EOSABS", "BASOSABS"   Chest imaging: CT Chest 11/09/22 Mediastinum/Nodes: Anterior mediastinal and left hilar calcified nodes are likely related to old granulomatous disease. No mediastinal or hilar adenopathy, given limitations of unenhanced CT.   Lungs/Pleura: No pleural fluid. Mild centrilobular emphysema. Biapical pleuroparenchymal scarring.   Left apical calcified granuloma on 33/8.  CT Chest 09/05/21 No indeterminate or suspicious focal pulmonary nodules identified. Dominant nodule within the left apex represents benign calcified granuloma.   Mild centrilobular emphysema.   Mild coronary artery calcification   Aortic Atherosclerosis (ICD10-I70.0) and Emphysema   PFT:    Latest Ref Rng & Units 12/15/2022    9:31 AM  PFT Results  FVC-Pre L 2.50  P  FVC-Predicted  Pre % 91  P  FVC-Post L 2.54  P  FVC-Predicted Post % 92  P  Pre FEV1/FVC % % 76  P  Post FEV1/FCV % % 76  P  FEV1-Pre L 1.90  P  FEV1-Predicted Pre % 92  P  FEV1-Post L 1.94  P  DLCO uncorrected ml/min/mmHg 16.11  P  DLCO UNC% % 86  P  DLCO corrected ml/min/mmHg 16.11  P  DLCO COR %Predicted % 86  P  DLVA Predicted % 85  P  TLC L 4.89  P  TLC % Predicted % 99  P  RV % Predicted % 92  P    P Preliminary result    Labs:  Path:  Echo:  Heart Catheterization:  Assessment & Plan:   Centrilobular emphysema (HCC)  Discussion: Sheryl Arias is a 77 year old woman, former smoker who returns to pulmonary clinic for emphysema.   She has mild centrilobular emphysema on CT Chest scans. Her PFTs are within normal limits today.  She can continue as needed albuterol.  No further CT Chest screening needed as she quit over 15 years ago. No pulmonary nodules noted on recent CT Chest.  Follow up as needed.  Melody Comas, MD [facility] Office: [phone]   Current Outpatient Medications:    albuterol (VENTOLIN HFA) 108 (90 Base) MCG/ACT inhaler, Inhale 2 puffs into the lungs every 6 (six) hours as needed for wheezing or shortness of breath., Disp: 8 g, Rfl: 6   alendronate (FOSAMAX) 70 MG tablet, TAKE 1 TABLET BY MOUTH ONCE A WEEK 30  MINUTES  BEFORE  THE  FIRST  FOOD,  BEVERAGE,  OR  MEDICINE  OF  THE  DAY  WITH  PLAIN  WATER, Disp: , Rfl:    Ascorbic Acid (VITAMIN C) 1000 MG tablet, 1 tablet, Disp: , Rfl:    aspirin 81 MG EC tablet, 1 tablet, Disp: , Rfl:    Calcium Citrate-Vitamin D 315-250 MG-UNIT TABS, 2 tablets, Disp: , Rfl:    cholecalciferol (VITAMIN D3) 25 MCG (1000 UNIT) tablet, Take 3,000 Units by mouth daily., Disp: , Rfl:    Estradiol 10 MCG TABS vaginal tablet, 1 tablet, Disp: , Rfl:    rosuvastatin (CRESTOR) 5 MG tablet, Take 1 tablet (5 mg total) by mouth daily., Disp: 90 tablet, Rfl: 3

## 2022-12-15 NOTE — Patient Instructions (Signed)
Your breathing tests are normal  Continue to use albuterol inhaler as needed  Follow up as needed, please call if you notice increase in breathing symptoms

## 2022-12-21 ENCOUNTER — Encounter (HOSPITAL_BASED_OUTPATIENT_CLINIC_OR_DEPARTMENT_OTHER): Payer: Self-pay | Admitting: Family

## 2022-12-21 ENCOUNTER — Encounter (HOSPITAL_BASED_OUTPATIENT_CLINIC_OR_DEPARTMENT_OTHER): Payer: Self-pay

## 2022-12-21 ENCOUNTER — Ambulatory Visit (HOSPITAL_BASED_OUTPATIENT_CLINIC_OR_DEPARTMENT_OTHER): Payer: Medicare Other | Admitting: Family

## 2022-12-21 VITALS — BP 122/60 | HR 51 | Ht 63.0 in | Wt 150.7 lb

## 2022-12-21 DIAGNOSIS — E785 Hyperlipidemia, unspecified: Secondary | ICD-10-CM | POA: Diagnosis not present

## 2022-12-21 DIAGNOSIS — I700 Atherosclerosis of aorta: Secondary | ICD-10-CM

## 2022-12-21 DIAGNOSIS — I2510 Atherosclerotic heart disease of native coronary artery without angina pectoris: Secondary | ICD-10-CM | POA: Diagnosis not present

## 2022-12-21 MED ORDER — ROSUVASTATIN CALCIUM 5 MG PO TABS
5.0000 mg | ORAL_TABLET | Freq: Every day | ORAL | 6 refills | Status: AC
Start: 2022-12-21 — End: ?

## 2022-12-21 NOTE — Progress Notes (Signed)
Office Visit    Patient Name: Sheryl Arias Date of Encounter: 12/21/2022  PCP:  Merri Brunette, MD    [facility]  Cardiologist:  Jodelle Red, MD  Advanced Practice Provider:  No care team member to display Electrophysiologist:  None      Chief Complaint    Sheryl Arias is a 77 y.o. female presents today for HLD follow up    Past Medical History    Past Medical History:  Diagnosis Date   Polio    History reviewed. No pertinent surgical history.  Allergies  Allergies  Allergen Reactions   Bee Venom     History of Present Illness    Sheryl Arias is a 77 y.o. female with a hx of chronic calcification on CT scan, aortic atherosclerosis, hyperlipidemia last seen 08/27/21.  Aortic atherosclerosis and mild coronary atherosclerosis of the LAD dating back to CT 2018.  Last seen 08/27/21 doing well from a cardiac perspective and recommended for follow-up in 2 years.  Presents today for follow up independently. Enjoys participating in her book club.  She is walking for exercise albeit somewhat intermittently.  Also works as a Lawyer.  Reports no chest pain, dyspnea, edema, orthopnea, PND, near-syncope, syncope. Reports episodes of lightheadedness. This occurs a couple times per week. Notes it occurs at odd intervals. For example, when she is standing at church. Most often with position changes. Drinks coffee, water, soda. Eats 3 meals per day.  Reviewed orthostatic precautions.  EKGs/Labs/Other Studies Reviewed:   The following studies were reviewed today:      EKG:  EKG is  ordered today.  The ekg ordered today demonstrates SB 51 bpm with no acute ST/T wave changes  Recent Labs: No results found for requested labs within last 365 days.  Recent Lipid Panel    Component Value Date/Time   CHOL 185 10/29/2020 0832   TRIG 41 10/29/2020 0832   HDL 112 10/29/2020 0832   CHOLHDL 1.7 10/29/2020 0832   LDLCALC 64 10/29/2020  0832    Home Medications   Current Meds  Medication Sig   albuterol (VENTOLIN HFA) 108 (90 Base) MCG/ACT inhaler Inhale 2 puffs into the lungs every 6 (six) hours as needed for wheezing or shortness of breath.   alendronate (FOSAMAX) 70 MG tablet TAKE 1 TABLET BY MOUTH ONCE A WEEK 30  MINUTES  BEFORE  THE  FIRST  FOOD,  BEVERAGE,  OR  MEDICINE  OF  THE  DAY  WITH  PLAIN  WATER   Ascorbic Acid (VITAMIN C) 1000 MG tablet 1 tablet   aspirin 81 MG EC tablet 1 tablet   Calcium Citrate-Vitamin D 315-250 MG-UNIT TABS 2 tablets   cholecalciferol (VITAMIN D3) 25 MCG (1000 UNIT) tablet Take 3,000 Units by mouth daily.   Estradiol 10 MCG TABS vaginal tablet 1 tablet   [DISCONTINUED] rosuvastatin (CRESTOR) 5 MG tablet Take 1 tablet (5 mg total) by mouth daily.     Review of Systems      All other systems reviewed and are otherwise negative except as noted above.  Physical Exam    VS:  BP 122/60 (BP Location: Left Arm, Patient Position: Sitting, Cuff Size: Normal)   Pulse (!) 51   Ht 5\' 3"  (1.6 m)   Wt 150 lb 11.2 oz (68.4 kg)   SpO2 96%   BMI 26.70 kg/m  , BMI Body mass index is 26.7 kg/m.  Wt Readings from Last 3 Encounters:  12/21/22 150 lb  11.2 oz (68.4 kg)  12/15/22 148 lb 6.4 oz (67.3 kg)  09/09/22 145 lb (65.8 kg)     GEN: Well nourished, well developed, in no acute distress. HEENT: normal. Neck: Supple, no JVD, carotid bruits, or masses. Cardiac: bradycardic, RRR, no murmurs, rubs, or gallops. No clubbing, cyanosis, edema.  Radials/PT 2+ and equal bilaterally.  Respiratory:  Respirations regular and unlabored, clear to auscultation bilaterally. GI: Soft, nontender, nondistended. MS: No deformity or atrophy. Skin: Warm and dry, no rash. Neuro:  Strength and sensation are intact. Psych: Normal affect.  Assessment & Plan    Coronary artery calcification / Aortic atherosclerosis -dating back to CT 2018. Stable with no anginal symptoms. No indication for ischemic evaluation.   GDMT Aspirin 81mg  QD, Rosuvastatin 5mg  QD. Recommend aiming for 150 minutes of moderate intensity activity per week and following a heart healthy diet.    HLD - 07/2022 LDL 67, ALT 24, AST 18.  Continue rosuvastatin 5 mg daily.         Disposition: Follow up in 2 year(s) with Jodelle Red, MD or APP.  Signed, Alver Sorrow, NP 12/21/2022, 10:00 AM [facility]

## 2022-12-21 NOTE — Patient Instructions (Addendum)
Medication Instructions:  Continue your current medications.   *If you need a refill on your cardiac medications before your next appointment, please call your pharmacy*  Lab Work: Your cholesterol numbers in January looked fantastic!   Testing/Procedures: Your EKG today showed sinus bradycardia which is a stable heart rhythm.   Follow-Up: At [facility], you and your health needs are our priority.  As part of our continuing mission to provide you with exceptional heart care, we have created designated Provider Care Teams.  These Care Teams include your primary Cardiologist (physician) and Advanced Practice Providers (APPs -  Physician Assistants and Nurse Practitioners) who all work together to provide you with the care you need, when you need it.  We recommend signing up for the patient portal called "MyChart".  Sign up information is provided on this After Visit Summary.  MyChart is used to connect with patients for Virtual Visits (Telemedicine).  Patients are able to view lab/test results, encounter notes, upcoming appointments, etc.  Non-urgent messages can be sent to your provider as well.   To learn more about what you can do with MyChart, go to [email or web address].    Your next appointment:   2 year(s)  Provider:   Jodelle Red, MD    Other Instructions

## 2022-12-22 DIAGNOSIS — M19112 Post-traumatic osteoarthritis, left shoulder: Secondary | ICD-10-CM | POA: Diagnosis not present

## 2022-12-29 DIAGNOSIS — G5601 Carpal tunnel syndrome, right upper limb: Secondary | ICD-10-CM | POA: Diagnosis not present

## 2023-01-26 DIAGNOSIS — I700 Atherosclerosis of aorta: Secondary | ICD-10-CM | POA: Diagnosis not present

## 2023-01-26 DIAGNOSIS — Z9849 Cataract extraction status, unspecified eye: Secondary | ICD-10-CM | POA: Diagnosis not present

## 2023-02-22 DIAGNOSIS — H53001 Unspecified amblyopia, right eye: Secondary | ICD-10-CM | POA: Diagnosis not present

## 2023-02-22 DIAGNOSIS — Z961 Presence of intraocular lens: Secondary | ICD-10-CM | POA: Diagnosis not present

## 2023-03-31 DIAGNOSIS — M25551 Pain in right hip: Secondary | ICD-10-CM | POA: Diagnosis not present

## 2023-03-31 DIAGNOSIS — M25552 Pain in left hip: Secondary | ICD-10-CM | POA: Diagnosis not present

## 2023-03-31 DIAGNOSIS — M47896 Other spondylosis, lumbar region: Secondary | ICD-10-CM | POA: Diagnosis not present

## 2023-04-06 ENCOUNTER — Telehealth: Payer: Self-pay | Admitting: Pulmonary Disease

## 2023-04-06 DIAGNOSIS — J432 Centrilobular emphysema: Secondary | ICD-10-CM

## 2023-04-06 NOTE — Telephone Encounter (Signed)
PT says more SOB than ever. Can not get a deep breath. Dr. Francine Graven mentioned an inhaler she can take in the AM first thing. Pls call to advise. [phone]

## 2023-04-09 MED ORDER — INCRUSE ELLIPTA 62.5 MCG/ACT IN AEPB
1.0000 | INHALATION_SPRAY | Freq: Every day | RESPIRATORY_TRACT | 5 refills | Status: AC
Start: 2023-04-09 — End: ?

## 2023-04-09 NOTE — Telephone Encounter (Signed)
Patient is aware of below message/recommendations and voiced her understanding.  Nothing further needed.  

## 2023-04-09 NOTE — Telephone Encounter (Signed)
She can start incruse ellipta 1 puff daily. Script sent to her pharmacy. She can continue albuterol inhaler 1-2 puffs every 4-6 hours as needed.  Thanks, JD

## 2023-04-09 NOTE — Telephone Encounter (Signed)
Called and spoke to patient.  She reports of increased SOB. SOB occurs suddenly and she feels that she can't take a deep breath.  Denied f/c/s, coughing, wheezing or additional sx.  She is using albuterol BID PRN. She is questioning if there are any lung exercises that she can do or inhaler.  Dr. Francine Graven, please advise. Thanks

## 2023-04-20 DIAGNOSIS — K080 Exfoliation of teeth due to systemic causes: Secondary | ICD-10-CM | POA: Diagnosis not present

## 2023-04-22 DIAGNOSIS — M25552 Pain in left hip: Secondary | ICD-10-CM | POA: Diagnosis not present

## 2023-04-22 DIAGNOSIS — M545 Low back pain, unspecified: Secondary | ICD-10-CM | POA: Diagnosis not present

## 2023-04-22 DIAGNOSIS — M25551 Pain in right hip: Secondary | ICD-10-CM | POA: Diagnosis not present

## 2023-05-21 ENCOUNTER — Telehealth: Payer: Self-pay | Admitting: *Deleted

## 2023-05-21 ENCOUNTER — Encounter: Payer: Self-pay | Admitting: Primary Care

## 2023-05-21 ENCOUNTER — Ambulatory Visit: Payer: Medicare Other | Admitting: Primary Care

## 2023-05-21 VITALS — BP 114/67 | HR 51 | Temp 97.4°F | Ht 63.0 in | Wt 149.2 lb

## 2023-05-21 DIAGNOSIS — J432 Centrilobular emphysema: Secondary | ICD-10-CM | POA: Diagnosis not present

## 2023-05-21 MED ORDER — SPIRIVA RESPIMAT 2.5 MCG/ACT IN AERS: 2.0000 | INHALATION_SPRAY | Freq: Every day | RESPIRATORY_TRACT | Status: DC

## 2023-05-21 NOTE — Progress Notes (Signed)
Patient seen in the office today and instructed on use of spiriva.  Patient expressed understanding and demonstrated technique.  

## 2023-05-21 NOTE — Telephone Encounter (Signed)
   Name: Sheryl Arias  DOB: 03-12-1946  MRN: [id number]  Primary Cardiologist: Jodelle Red, MD   Preoperative team, please contact this patient and set up a phone call appointment for further preoperative risk assessment. Please obtain consent and complete medication review. Thank you for your help.  I confirm that guidance regarding antiplatelet and oral anticoagulation therapy has been completed and, if necessary, noted below.  Patient can hold ASA 81 mg 7 days prior to procedure and should restart postprocedure when surgically safe.  I also confirmed the patient resides in the state of [place]. As per [facility] telemedicine laws, the patient must reside in the state in which the provider is licensed.   Napoleon Form, Leodis Rains, NP 05/21/2023, 4:31 PM [facility]

## 2023-05-21 NOTE — Telephone Encounter (Signed)
   Pre-operative Risk Assessment    Patient Name: Sheryl Arias  DOB: 1945-12-05 MRN: [id number]    DATE OF LAST VISIT: 12/21/22 Gillian Shields, NP DATE OF NEXT VISIT: NONE  Request for Surgical Clearance    Procedure:   RIGHT CARPAL TUNNEL RELEASE  Date of Surgery:  Clearance TBD                                 Surgeon:  DR. CHARLES BENFIELD Surgeon's Group or Practice Name:  [facility] Phone number:  [phone] KERRI MAZE Fax number:  [phone]   Type of Clearance Requested:   - Medical  - Pharmacy:  Hold Aspirin     Type of Anesthesia:   CHOICE   Additional requests/questions:    Elpidio Anis   05/21/2023, 3:02 PM

## 2023-05-21 NOTE — Patient Instructions (Addendum)
Recommendations: - Start 2 week trial Spiriva respimat- take two puffs daily in the morning (Hold Incruse while taking Spiriva) - Let us know if new inhaler was easier to use and/or beneficial and we will send in prescription  - Use incentive spirometer 5-10 deep breaths three times a day   Orders: - Please call patients pharmacy ([facility]) to see if she had her covid vaccine this fall with them   Follow-up: - 3 months with Dr. Francine Graven or [facility] (30 min- new patient visit for emphysema/ [facility])    COPD and Physical Activity Chronic obstructive pulmonary disease (COPD) is a long-term, or chronic, condition that affects the lungs. COPD is a general term that can be used to describe many problems that cause inflammation of the lungs and limit airflow. These conditions include chronic bronchitis and emphysema. The main symptom of COPD is shortness of breath, which makes it harder to do even simple tasks. This can also make it harder to exercise and stay active. Talk with your health care provider about treatments to help you breathe better and actions you can take to prevent breathing problems during physical activity. What are the benefits of exercising when you have COPD? Exercising regularly is an important part of a healthy lifestyle. You can still exercise and do physical activities even though you have COPD. Exercise and physical activity improve your shortness of breath by increasing blood flow (circulation). This causes your heart to pump more oxygen through your body. Moderate exercise can: Improve oxygen use. Increase your energy level. Help with shortness of breath. Strengthen your breathing muscles. Improve heart health. Help with sleep. Improve your self-esteem and feelings of self-worth. Lower depression, stress, and anxiety. Exercise can benefit everyone with COPD. The severity of your disease may affect how hard you can exercise, especially at first, but everyone can  benefit. Talk with your health care provider about how much exercise is safe for you, and which activities and exercises are safe for you. What actions can I take to prevent breathing problems during physical activity? Sign up for a pulmonary rehabilitation program. This type of program may include: Education about lung diseases. Exercise classes that teach you how to exercise and be more active while improving your breathing. This usually involves: Exercise using your lower extremities, such as a stationary bicycle. About 30 minutes of exercise, 2 to 5 times per week, for 6 to 12 weeks. Strength training, such as push-ups or leg lifts. Nutrition education. Group classes in which you can talk with others who also have COPD and learn ways to manage stress. If you use an oxygen tank, you should use it while you exercise. Work with your health care provider to adjust your oxygen for your physical activity. Your resting flow rate is different from your flow rate during physical activity. How to manage your breathing while exercising While you are exercising: Take slow breaths. Pace yourself, and do nottry to go too fast. Purse your lips while breathing out. Pursing your lips is similar to a kissing or whistling position. If doing exercise that uses a quick burst of effort, such as weight lifting: Breathe in before starting the exercise. Breathe out during the hardest part of the exercise, such as raising the weights. Where to find support You can find support for exercising with COPD from: Your health care provider. A pulmonary rehabilitation program. Your local health department or community health programs. Support groups, either online or in-person. Your health care provider may be able  to recommend support groups. Where to find more information You can find more information about exercising with COPD from: American Lung Association: lung.org COPD Foundation: copdfoundation.org Contact a  health care provider if: Your symptoms get worse. You have nausea. You have a fever. You want to start a new exercise program or a new activity. Get help right away if: You have chest pain. You cannot breathe. These symptoms may represent a serious problem that is an emergency. Do not wait to see if the symptoms will go away. Get medical help right away. Call your local emergency services (911 in the [place] Do not drive yourself to the hospital. Summary COPD is a general term that can be used to describe many different lung problems that cause lung inflammation and limit airflow. This includes chronic bronchitis and emphysema. Exercise and physical activity improve your shortness of breath by increasing blood flow (circulation). This causes your heart to provide more oxygen to your body. Contact your health care provider before starting any exercise program or new activity. Ask your health care provider what exercises and activities are safe for you. This information is not intended to replace advice given to you by your health care provider. Make sure you discuss any questions you have with your health care provider. Document Revised: 05/07/2020 Document Reviewed: 05/07/2020 Elsevier Patient Education  2024 Elsevier Inc.      What is Emphysema? This video will teach you about the lung disease emphysema and how it affects the body. To view the content, go to this web address: [email or web address].[email or web address].[email or web address]  This video will expire on: 01/03/2025. If you need access to this video following this date, please reach out to the healthcare provider who assigned it to you. This information is not intended to replace advice given to you by your health care provider. Make sure you discuss any questions you have with your health care provider. Elsevier Patient Education  2024 [employer].

## 2023-05-21 NOTE — Progress Notes (Signed)
@Patient  ID: Sheryl Arias, female    DOB: 1945/10/14, 77 y.o.   MRN: [id number]  Chief Complaint  Patient presents with   Follow-up    Referring provider: Merri Brunette, MD  HPI: 77 year old female, former smoker.  Past medical history significant for centrilobular emphysema, arthritis, osteoporosis, hyperlipidemia.  Patient of Dr. Francine Graven.  05/21/2023 Discussed the use of AI scribe software for clinical note transcription with the patient, who gave verbal consent to proceed.  History of Present Illness   Patient presents today for 5 to 15-month follow-up.  Former smoker with mild emphysema on CT imaging.  PFTs have been within normal limits.    She reports experiencing episodes of shortness of breath over the past few months. These episodes are characterized by a sudden onset and a feeling of not being able to take a deep breath. The patient has been managing these episodes by consciously focusing on her breathing, specifically by exhaling fully to empty her lungs before taking a deep breath.  The patient has been using an as-needed albuterol inhaler, but reports infrequent use. She has also been using Incruse, a dry powder inhaler, daily in the mornings. However, she reports throat irritation and a need to clear her throat frequently after using this inhaler. She has not experienced any anxiety related to her breathing issues. The patient has received her flu shot and possibly a COVID-19 vaccine recently, but is unsure about her pneumonia vaccination status.    Allergies  Allergen Reactions   Bee Venom     Immunization History  Administered Date(s) Administered   Fluad Trivalent(High Dose 65+) 04/20/2023   PFIZER(Purple Top)SARS-COV-2 Vaccination 08/04/2019, 08/25/2019, 04/16/2020    Past Medical History:  Diagnosis Date   Polio     Tobacco History: Social History   Tobacco Use  Smoking Status Former   Current packs/day: 0.00   Average packs/day: 1 pack/day for 45.0  years (45.0 ttl pk-yrs)   Types: Cigarettes   Start date: 1963   Quit date: 2008   Years since quitting: 16.8  Smokeless Tobacco Never   Counseling given: Not Answered   Outpatient Medications Prior to Visit  Medication Sig Dispense Refill   albuterol (VENTOLIN HFA) 108 (90 Base) MCG/ACT inhaler Inhale 2 puffs into the lungs every 6 (six) hours as needed for wheezing or shortness of breath. 8 g 6   alendronate (FOSAMAX) 70 MG tablet TAKE 1 TABLET BY MOUTH ONCE A WEEK 30  MINUTES  BEFORE  THE  FIRST  FOOD,  BEVERAGE,  OR  MEDICINE  OF  THE  DAY  WITH  PLAIN  WATER     Ascorbic Acid (VITAMIN C) 1000 MG tablet 1 tablet     aspirin 81 MG EC tablet 1 tablet     Calcium Citrate-Vitamin D 315-250 MG-UNIT TABS 2 tablets     cholecalciferol (VITAMIN D3) 25 MCG (1000 UNIT) tablet Take 3,000 Units by mouth daily.     Estradiol 10 MCG TABS vaginal tablet 1 tablet     rosuvastatin (CRESTOR) 5 MG tablet Take 1 tablet (5 mg total) by mouth daily. 90 tablet 6   umeclidinium bromide (INCRUSE ELLIPTA) 62.5 MCG/ACT AEPB Inhale 1 puff into the lungs daily. 30 each 5   No facility-administered medications prior to visit.   Review of Systems  Review of Systems  Constitutional: Negative.   HENT: Negative.    Respiratory:  Positive for cough and shortness of breath.   Cardiovascular: Negative.      Physical  Exam  BP 114/67 (BP Location: Left Arm, Patient Position: Sitting, Cuff Size: Normal)   Pulse (!) 51   Temp (!) 97.4 F (36.3 C) (Oral)   Ht 5\' 3"  (1.6 m)   Wt 149 lb 3.2 oz (67.7 kg)   SpO2 96%   BMI 26.43 kg/m  Physical Exam Constitutional:      Appearance: Normal appearance.  HENT:     Head: Normocephalic and atraumatic.  Cardiovascular:     Rate and Rhythm: Normal rate and regular rhythm.  Pulmonary:     Effort: Pulmonary effort is normal.     Breath sounds: Normal breath sounds. No wheezing or rhonchi.  Skin:    General: Skin is warm and dry.  Neurological:     General: No  focal deficit present.     Mental Status: She is alert and oriented to person, place, and time. Mental status is at baseline.  Psychiatric:        Mood and Affect: Mood normal.        Behavior: Behavior normal.        Thought Content: Thought content normal.        Judgment: Judgment normal.      Lab Results:  CBC No results found for: "WBC", "RBC", "HGB", "HCT", "PLT", "MCV", "MCH", "MCHC", "RDW", "LYMPHSABS", "MONOABS", "EOSABS", "BASOSABS"  BMET No results found for: "NA", "K", "CL", "CO2", "GLUCOSE", "BUN", "CREATININE", "CALCIUM", "GFRNONAA", "GFRAA"  BNP No results found for: "BNP"  ProBNP No results found for: "PROBNP"  Imaging: No results found.   Assessment & Plan:   1. Centrilobular emphysema (HCC) - Ambulatory Referral for DME   Mild Emphysema Reports occasional episodes of shortness of breath and difficulty taking deep breaths. No exacerbation of symptoms. Currently on Incruse Ellipta (umeclidinium bromide) with some throat irritation post-use. -Discontinue Incruse Ellipta. -Trial of Spiriva Respimat (tiotropium bromide) for two weeks. -Use incentive spirometer 3 times a day for deep breathing exercises. -Call office with feedback on Spiriva Respimat effectiveness and will send in RX.  Immunizations Cytogeneticist on [address] to confirm recent immunizations.     Follow-up: - 3 months with Dr. Francine Graven or [facility] (30 min- new patient visit for emphysema/ [facility])   Glenford Bayley, NP 05/21/2023

## 2023-05-24 NOTE — Telephone Encounter (Signed)
Left message to call back and schedule a tele pre op appt.  

## 2023-05-25 ENCOUNTER — Telehealth: Payer: Self-pay | Admitting: *Deleted

## 2023-05-25 NOTE — Telephone Encounter (Signed)
Pt is scheduled for tele pre op appt 06/08/23. Med rec and consent are done.

## 2023-05-25 NOTE — Telephone Encounter (Signed)
Pt is scheduled for tele pre op appt 06/08/23. Med rec and consent are done.     Patient Consent for Virtual Visit        Sheryl Arias has provided verbal consent on 05/25/2023 for a virtual visit (video or telephone).   CONSENT FOR VIRTUAL VISIT FOR:  Sheryl Arias  By participating in this virtual visit I agree to the following:  I hereby voluntarily request, consent and authorize [facility] and its employed or contracted physicians, physician assistants, nurse practitioners or other licensed health care professionals (the Practitioner), to provide me with telemedicine health care services (the "Services") as deemed necessary by the treating Practitioner. I acknowledge and consent to receive the Services by the Practitioner via telemedicine. I understand that the telemedicine visit will involve communicating with the Practitioner through live audiovisual communication technology and the disclosure of certain medical information by electronic transmission. I acknowledge that I have been given the opportunity to request an in-person assessment or other available alternative prior to the telemedicine visit and am voluntarily participating in the telemedicine visit.  I understand that I have the right to withhold or withdraw my consent to the use of telemedicine in the course of my care at any time, without affecting my right to future care or treatment, and that the Practitioner or I may terminate the telemedicine visit at any time. I understand that I have the right to inspect all information obtained and/or recorded in the course of the telemedicine visit and may receive copies of available information for a reasonable fee.  I understand that some of the potential risks of receiving the Services via telemedicine include:  Delay or interruption in medical evaluation due to technological equipment failure or disruption; Information transmitted may not be sufficient (e.g. poor  resolution of images) to allow for appropriate medical decision making by the Practitioner; and/or  In rare instances, security protocols could fail, causing a breach of personal health information.  Furthermore, I acknowledge that it is my responsibility to provide information about my medical history, conditions and care that is complete and accurate to the best of my ability. I acknowledge that Practitioner's advice, recommendations, and/or decision may be based on factors not within their control, such as incomplete or inaccurate data provided by me or distortions of diagnostic images or specimens that may result from electronic transmissions. I understand that the practice of medicine is not an exact science and that Practitioner makes no warranties or guarantees regarding treatment outcomes. I acknowledge that a copy of this consent can be made available to me via my patient portal [facility] MyChart), or I can request a printed copy by calling the office of [facility].    I understand that my insurance will be billed for this visit.   I have read or had this consent read to me. I understand the contents of this consent, which adequately explains the benefits and risks of the Services being provided via telemedicine.  I have been provided ample opportunity to ask questions regarding this consent and the Services and have had my questions answered to my satisfaction. I give my informed consent for the services to be provided through the use of telemedicine in my medical care

## 2023-05-25 NOTE — Telephone Encounter (Signed)
Pt returning your call. She said you can try her on both phone numbers.

## 2023-05-31 DIAGNOSIS — G5601 Carpal tunnel syndrome, right upper limb: Secondary | ICD-10-CM | POA: Diagnosis not present

## 2023-06-01 ENCOUNTER — Telehealth: Payer: Self-pay | Admitting: Primary Care

## 2023-06-01 NOTE — Telephone Encounter (Signed)
  Per pt, she spoke with Dr. Frazier Butt and will not go through with the procedure at this time. She cancelled her televisit appointment

## 2023-06-01 NOTE — Telephone Encounter (Signed)
Patient has tried  Spiriva respimat after last office visit and likes it. She would like a refill for it.   Pharmacy [facility] on [address]

## 2023-06-07 NOTE — Telephone Encounter (Signed)
Pt presented to the front. States she called in 11/19 for a Spiriva refill. Tried to call again this AM but she was # 9 on hold. Please call in RX. (Do not call in Elipta, that was her old TRX. MS. Clent Ridges gave her a Spiriva sample and she liked it and is out. ) Her # is [phone] Cell is [phone].  Pharm says they are out of stock so she also would like another sample. I am asking triage now.   Pharmacy [facility] on [address]

## 2023-06-07 NOTE — Telephone Encounter (Signed)
Pt came in the office today for samples. She is going on a trip and prescription is on back order.nfn

## 2023-06-08 NOTE — Telephone Encounter (Signed)
Ok to provide samples, thanks

## 2023-06-09 ENCOUNTER — Telehealth: Payer: Medicare Other

## 2023-06-24 IMAGING — CT CT CHEST W/O CM
1 series · 15 of 34 positions shown, 19 images · non-contrast
Comparison: 08/07/2020

CLINICAL DATA: Skin cancer, pulmonary nodule



[Series 2: chest w/(date) · axial · 0.68mm/px · z∈[-326,-64]mm · 15 of 155 slices shown, 19 images]
[im 12/155  mediastinal]
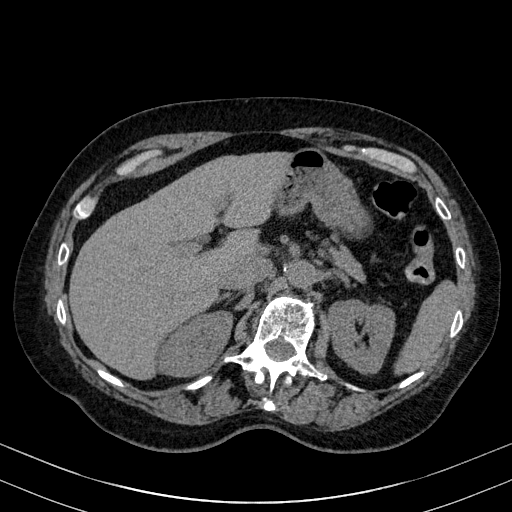
[im 12/155  lung]
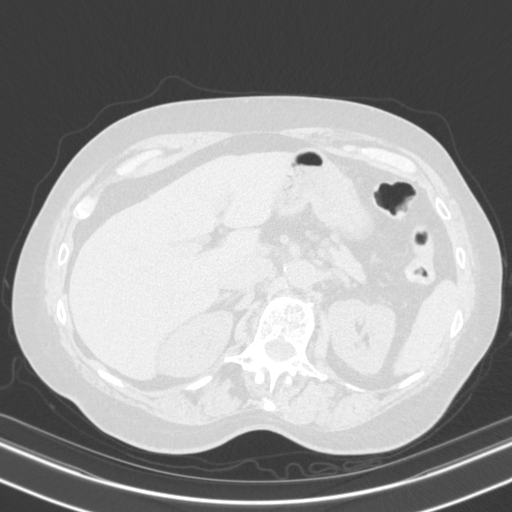
[im 23/155  lung]
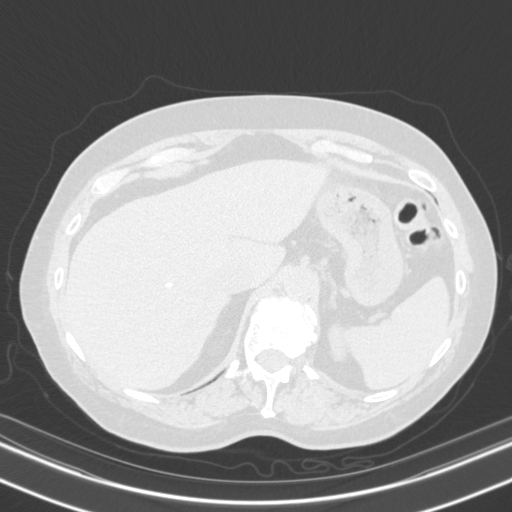
[im 31/155  lung]
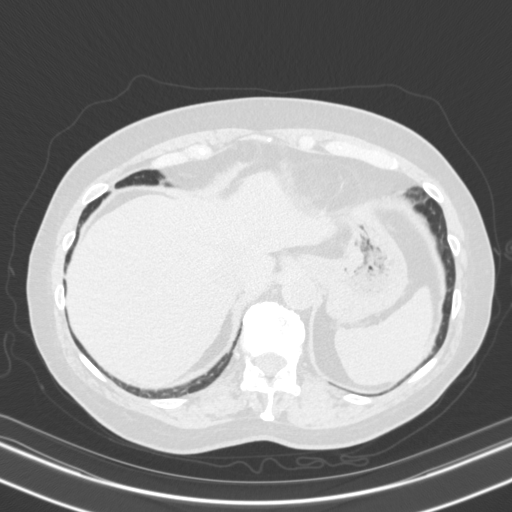
[im 40/155  lung]
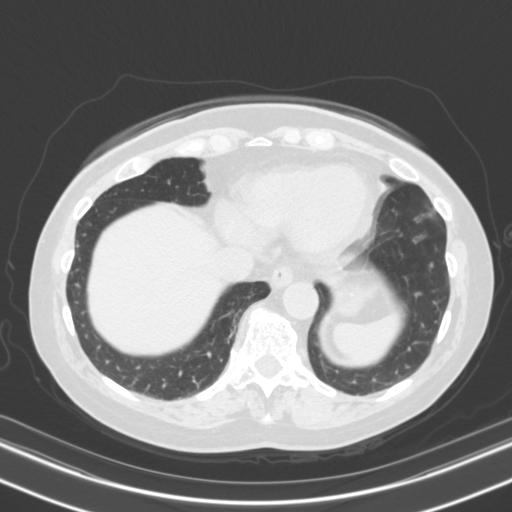
[im 52/155  mediastinal]
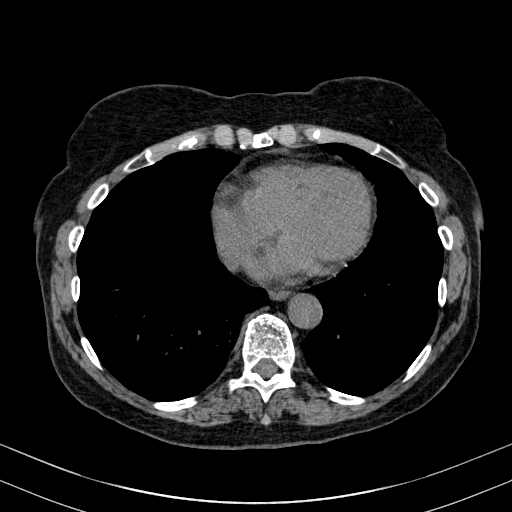
[im 52/155  lung]
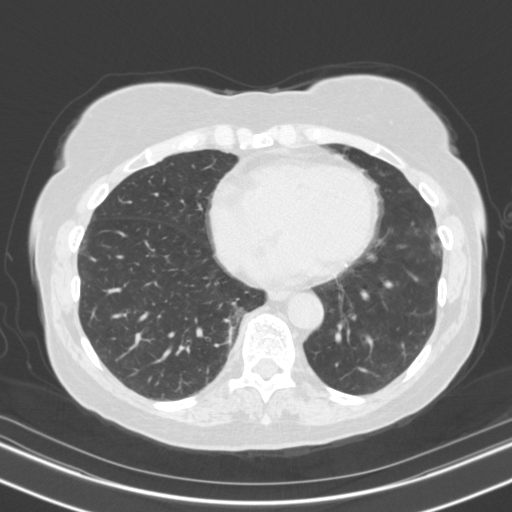
[im 62/155  lung]
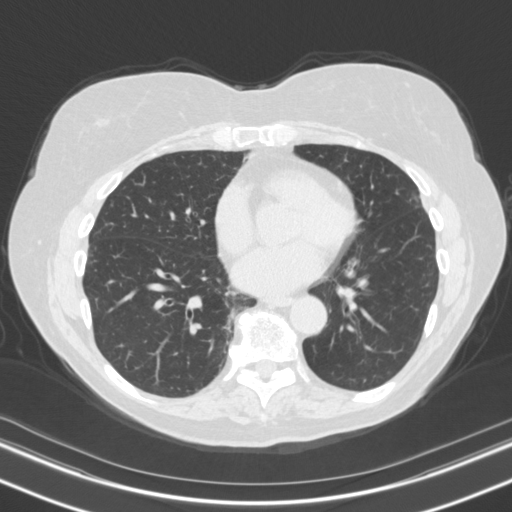
[im 69/155  lung]
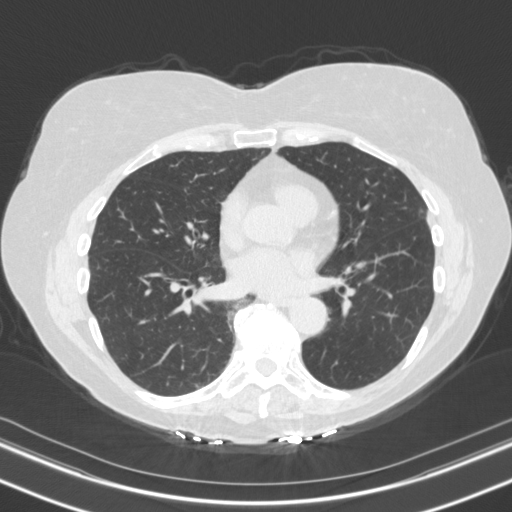
[im 80/155  lung]
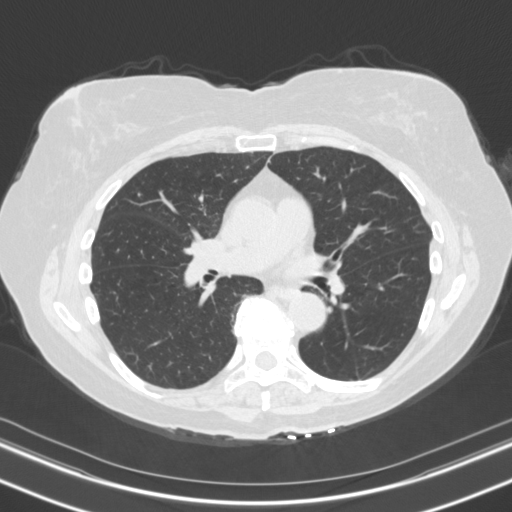
[im 86/155  mediastinal]
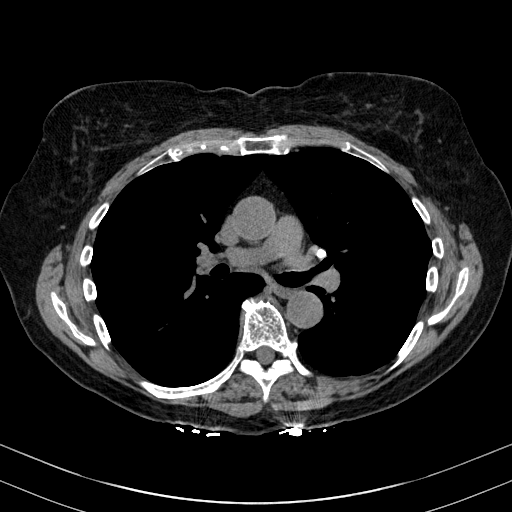
[im 86/155  lung]
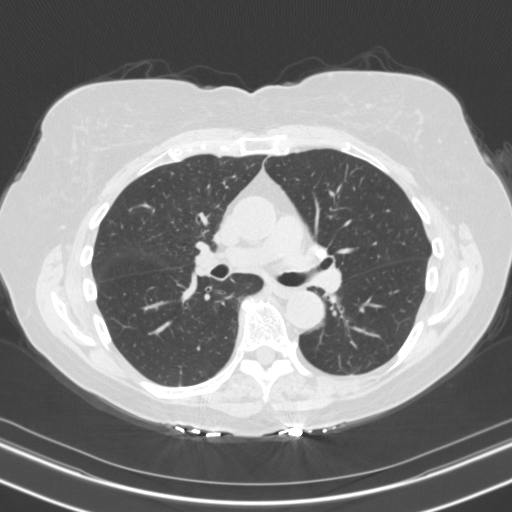
[im 93/155  lung]
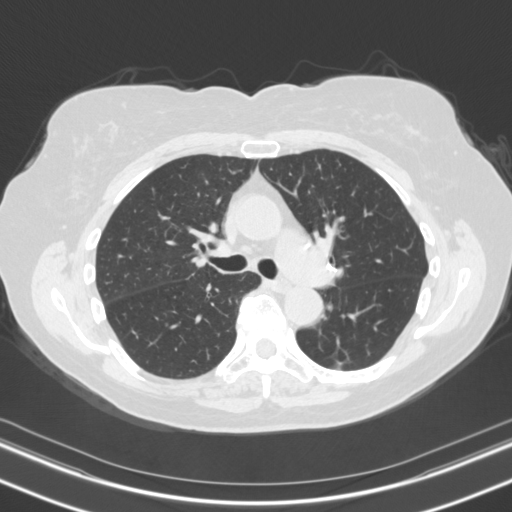
[im 103/155  lung]
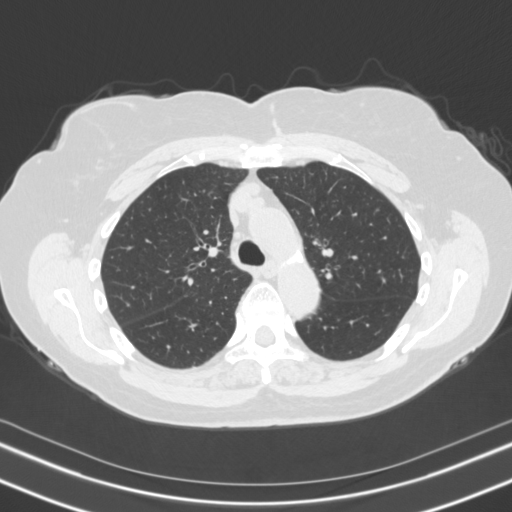
[im 115/155  lung]
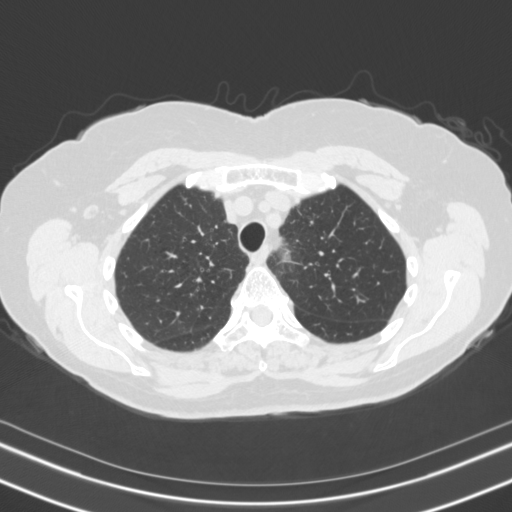
[im 124/155  mediastinal]
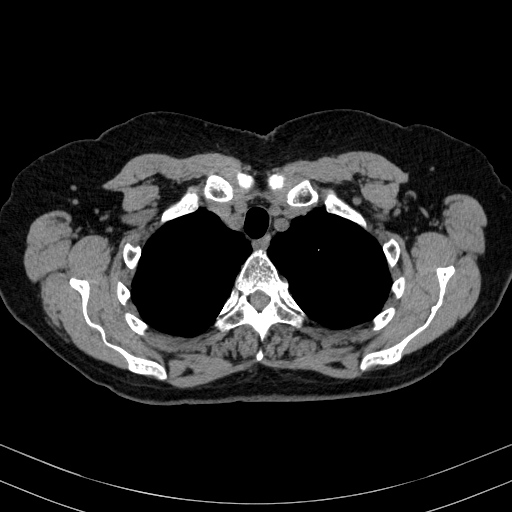
[im 124/155  lung]
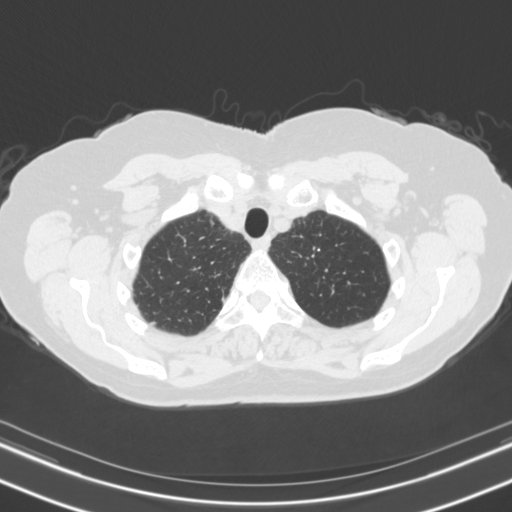
[im 132/155  lung]
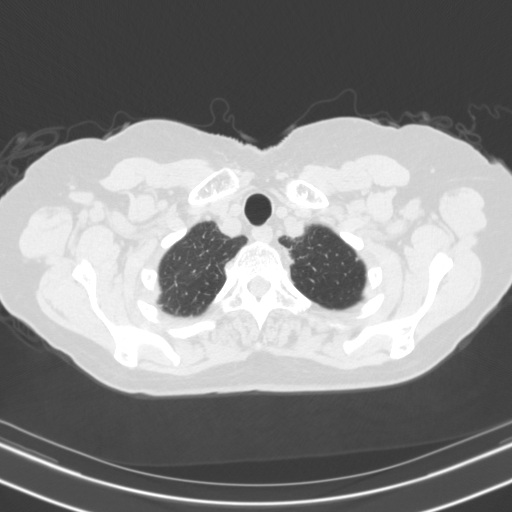
[im 143/155  lung]
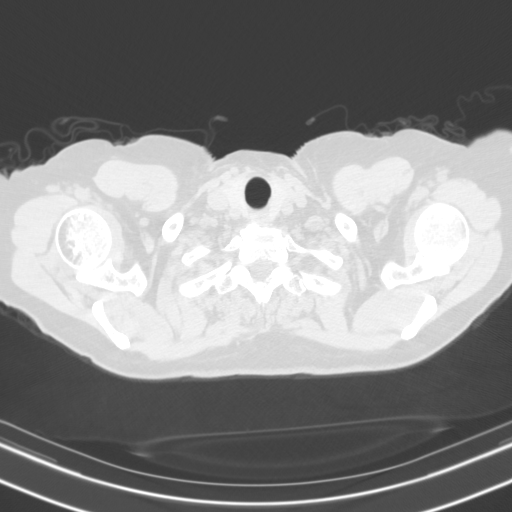

[15 of 34 positions shown; findings below may reference images not displayed]

FINDINGS: Cardiovascular: Mild coronary artery calcification. Global cardiac
size within normal limits. No pericardial effusion. Central
pulmonary arteries are of normal caliber. Mild atherosclerotic
calcification within the thoracic aorta. No aortic aneurysm.

Mediastinum/Nodes: No enlarged mediastinal or axillary lymph nodes.
Thyroid gland, trachea, and esophagus demonstrate no significant
findings.

Lungs/Pleura: Mild centrilobular emphysema. Benign calcified
granuloma noted at the left apex. Mild asymmetric biapical
parenchymal scarring. Stable probable post inflammatory fibrotic
change within the anteromedial left lower lobe and lingula. No
suspicious or indeterminate focal pulmonary nodules. No pneumothorax
or pleural effusion. Central airways are widely patent.

Upper Abdomen: No acute abnormality.

Musculoskeletal: Osseous structures are diffusely osteopenic. Remote
appearing superior endplate fracture of T1 noted with mild loss of
height. Degenerative changes seen throughout the thoracic spine.
IMPRESSION: No indeterminate or suspicious focal pulmonary nodules identified.
Dominant nodule within the left apex represents benign calcified
granuloma.

Mild centrilobular emphysema.

Mild coronary artery calcification

Aortic Atherosclerosis (SSDY9-MU8.8) and Emphysema (SSDY9-VHJ.P).

## 2023-06-28 NOTE — Telephone Encounter (Signed)
Our office received a duplicate request. See previous notes. I did leave a message today for the pt to please call our office and ask to s/w the pre op team. We need to confirm if pt is now going to proceed with the surgery listed in the previous notes with Dr. Frazier Butt. If proceeding the pt will need to schedule a tele pre op appt. Or at least confirm if she still is not planning on the surgery.

## 2023-06-29 NOTE — Telephone Encounter (Signed)
I s/w the pt today and she tells me that she has not decided to proceed with carpal tunnel surgery at this time. She states that she and Dr. Frazier Butt feels since she is generally asymptomatic with the occasional flare up, to hold off on surgery until necessary.   I assured the pt that I will send an update to surgery scheduler Kerri Maze.  Pt thanked me for the help.  When pt is ready to proceed with surgery we will need a new request to be faxed to our office [phone] attn: pre op team.

## 2023-06-30 ENCOUNTER — Telehealth: Payer: Self-pay | Admitting: Primary Care

## 2023-06-30 NOTE — Telephone Encounter (Signed)
Patient states needs refill for Spiriva Respimat. Pharmacy is [facility] [address] Patient phone number is [phone] and [phone].  Patient available until 11:00 am. May leave a detailed message on voicemail.

## 2023-07-02 MED ORDER — SPIRIVA RESPIMAT 2.5 MCG/ACT IN AERS: 2.0000 | INHALATION_SPRAY | Freq: Every day | RESPIRATORY_TRACT | 11 refills | Status: DC

## 2023-07-02 NOTE — Telephone Encounter (Signed)
Prescription for Spiriva has been sent to preferred pharm

## 2023-08-10 DIAGNOSIS — E663 Overweight: Secondary | ICD-10-CM | POA: Diagnosis not present

## 2023-08-10 DIAGNOSIS — N390 Urinary tract infection, site not specified: Secondary | ICD-10-CM | POA: Diagnosis not present

## 2023-08-10 DIAGNOSIS — E785 Hyperlipidemia, unspecified: Secondary | ICD-10-CM | POA: Diagnosis not present

## 2023-08-10 DIAGNOSIS — I700 Atherosclerosis of aorta: Secondary | ICD-10-CM | POA: Diagnosis not present

## 2023-08-16 DIAGNOSIS — L570 Actinic keratosis: Secondary | ICD-10-CM | POA: Diagnosis not present

## 2023-08-16 DIAGNOSIS — L821 Other seborrheic keratosis: Secondary | ICD-10-CM | POA: Diagnosis not present

## 2023-08-16 DIAGNOSIS — L578 Other skin changes due to chronic exposure to nonionizing radiation: Secondary | ICD-10-CM | POA: Diagnosis not present

## 2023-08-16 DIAGNOSIS — D225 Melanocytic nevi of trunk: Secondary | ICD-10-CM | POA: Diagnosis not present

## 2023-08-16 DIAGNOSIS — L814 Other melanin hyperpigmentation: Secondary | ICD-10-CM | POA: Diagnosis not present

## 2023-08-30 DIAGNOSIS — I700 Atherosclerosis of aorta: Secondary | ICD-10-CM | POA: Diagnosis not present

## 2023-08-30 DIAGNOSIS — E559 Vitamin D deficiency, unspecified: Secondary | ICD-10-CM | POA: Diagnosis not present

## 2023-08-30 DIAGNOSIS — Z0000 Encounter for general adult medical examination without abnormal findings: Secondary | ICD-10-CM | POA: Diagnosis not present

## 2023-08-30 DIAGNOSIS — J439 Emphysema, unspecified: Secondary | ICD-10-CM | POA: Diagnosis not present

## 2023-08-30 DIAGNOSIS — E785 Hyperlipidemia, unspecified: Secondary | ICD-10-CM | POA: Diagnosis not present

## 2023-08-31 ENCOUNTER — Other Ambulatory Visit: Payer: Self-pay | Admitting: Registered Nurse

## 2023-08-31 DIAGNOSIS — R911 Solitary pulmonary nodule: Secondary | ICD-10-CM

## 2023-09-01 ENCOUNTER — Ambulatory Visit: Payer: Medicare Other | Admitting: Pulmonary Disease

## 2023-09-01 ENCOUNTER — Encounter: Payer: Self-pay | Admitting: Pulmonary Disease

## 2023-09-01 VITALS — BP 138/76 | HR 62 | Ht 63.0 in | Wt 152.0 lb

## 2023-09-01 DIAGNOSIS — J432 Centrilobular emphysema: Secondary | ICD-10-CM

## 2023-09-01 NOTE — Patient Instructions (Signed)
Continue spiriva inhaler  2 puffs daily  Use albuterol inhaler 1-2 puffs every 4-6 hours as needed  Follow up in 1 year, call sooner if needed.

## 2023-09-01 NOTE — Progress Notes (Signed)
Synopsis: Referred in February 2024 for emphysema by Merri Brunette, MD  Subjective:   PATIENT ID: Sheryl Arias GENDER: female DOB: [birth year], MRN: [id number]  HPI  Chief Complaint  Patient presents with   Follow-up    Pt states the sprivia has helped a lot    Sheryl Arias is a 78 year old woman, former smoker who returns to pulmonary clinic for emphysema.   Initial OV 09/09/22 She has been enrolled in lung cancer screening program and LCS CT Chests have shown mild centrilobular emphysematous changes.   She has exertional shortness of breath when walking inclines or if she is in a hurry/rush. She denies wheezing, cough or sputum production. She has no night time awakenings due to cough, dyspnea or wheezing. She denies seasonal allergies. No issues with reflux.   She has 40 pack year history. She had second hand smoke exposure from parents in childhood. 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 No exposure to embalming fluids. No pets. Her sister has emphysema and is on oxygen.  OV - 12/15/22 She denies any issues since last visit. She has been using albuterol as needed with some relief.  PFTs are within normal limits today.  OV 09/01/23 She finds Spiriva beneficial for managing her respiratory symptoms, describing it as 'much better' compared to her previous medication, which she found 'almost choking' due to its powdery nature. She uses albuterol rarely, approximately once a month, and often finds she does not need it after slowing down her activities.  She experiences difficulty in taking deep breaths, which she noticed when she first started using Spiriva. She also mentions frequent yawning, which she attributes partly to not going to bed early enough and possibly to not getting enough oxygen. Her aunt had also commented on her frequent yawning.  She engages in [facility]  and reading, during which she tends to hunch over, potentially affecting her breathing. She acknowledges that her posture might be contributing to her breathing difficulties.  Past Medical History:  Diagnosis Date   Polio      No family history on file.   Social History   Socioeconomic History   Marital status: Widowed    Spouse name: Not on file   Number of children: Not on file   Years of education: Not on file   Highest education level: Not on file  Occupational History   Not on file  Tobacco Use   Smoking status: Former    Current packs/day: 0.00    Average packs/day: 1 pack/day for 45.0 years (45.0 ttl pk-yrs)    Types: Cigarettes    Start date: 13    Quit date: 2008    Years since quitting: 17.1   Smokeless tobacco: Never  Substance and Sexual Activity   Alcohol use: Not on file   Drug use: Not on file   Sexual activity: Not on file  Other Topics Concern   Not on file  Social History Narrative   Not on file   Social Drivers of Health   Financial Resource Strain: Not on file  Food Insecurity: Not on file  Transportation Needs: Not on file  Physical Activity: Not on file  Stress: Not on file  Social Connections: Not on file  Intimate Partner Violence: Not on file     Allergies  Allergen Reactions   Bee Venom      Outpatient Medications  Prior to Visit  Medication Sig Dispense Refill   albuterol (VENTOLIN HFA) 108 (90 Base) MCG/ACT inhaler Inhale 2 puffs into the lungs every 6 (six) hours as needed for wheezing or shortness of breath. 8 g 6   alendronate (FOSAMAX) 70 MG tablet TAKE 1 TABLET BY MOUTH ONCE A WEEK 30  MINUTES  BEFORE  THE  FIRST  FOOD,  BEVERAGE,  OR  MEDICINE  OF  THE  DAY  WITH  PLAIN  WATER     Ascorbic Acid (VITAMIN C) 1000 MG tablet 1 tablet     aspirin 81 MG EC tablet 1 tablet     Calcium Citrate-Vitamin D 315-250 MG-UNIT TABS 2 tablets     cholecalciferol (VITAMIN D3) 25 MCG (1000 UNIT) tablet Take 3,000 Units by mouth daily.      rosuvastatin (CRESTOR) 5 MG tablet Take 1 tablet (5 mg total) by mouth daily. 90 tablet 6   Tiotropium Bromide Monohydrate (SPIRIVA RESPIMAT) 2.5 MCG/ACT AERS Inhale 2 puffs into the lungs daily. 4 g 11   Estradiol 10 MCG TABS vaginal tablet 1 tablet (Patient not taking: Reported on 09/01/2023)     umeclidinium bromide (INCRUSE ELLIPTA) 62.5 MCG/ACT AEPB Inhale 1 puff into the lungs daily. (Patient not taking: Reported on 09/01/2023) 30 each 5   No facility-administered medications prior to visit.    Review of Systems  Constitutional:  Negative for chills, fever, malaise/fatigue and weight loss.  HENT:  Negative for congestion, sinus pain and sore throat.   Eyes: Negative.   Respiratory:  Positive for shortness of breath (with exertion). Negative for cough, hemoptysis, sputum production and wheezing.   Cardiovascular:  Negative for chest pain, palpitations, orthopnea, claudication and leg swelling.  Gastrointestinal:  Negative for abdominal pain, heartburn, nausea and vomiting.  Genitourinary: Negative.   Musculoskeletal:  Negative for joint pain and myalgias.  Skin:  Negative for rash.  Neurological:  Negative for weakness.  Endo/Heme/Allergies: Negative.   Psychiatric/Behavioral: Negative.     Objective:   Vitals:   09/01/23 1037  BP: 138/76  Pulse: 62  SpO2: 99%  Weight: 152 lb (68.9 kg)  Height: 5\' 3"  (1.6 m)    Physical Exam Constitutional:      General: She is not in acute distress.    Appearance: She is not ill-appearing.  HENT:     Head: Normocephalic and atraumatic.  Eyes:     General: No scleral icterus.    Conjunctiva/sclera: Conjunctivae normal.  Cardiovascular:     Rate and Rhythm: Normal rate and regular rhythm.     Pulses: Normal pulses.     Heart sounds: Normal heart sounds. No murmur heard. Pulmonary:     Effort: Pulmonary effort is normal.     Breath sounds: Normal breath sounds. No wheezing, rhonchi or rales.  Musculoskeletal:     Right lower leg: No  edema.     Left lower leg: No edema.  Skin:    General: Skin is warm and dry.  Neurological:     General: No focal deficit present.     Mental Status: She is alert.     CBC No results found for: "WBC", "RBC", "HGB", "HCT", "PLT", "MCV", "MCH", "MCHC", "RDW", "LYMPHSABS", "MONOABS", "EOSABS", "BASOSABS"   Chest imaging: CT Chest 11/09/22 Mediastinum/Nodes: Anterior mediastinal and left hilar calcified nodes are likely related to old granulomatous disease. No mediastinal or hilar adenopathy, given limitations of unenhanced CT.   Lungs/Pleura: No pleural fluid. Mild centrilobular emphysema. Biapical pleuroparenchymal scarring.  Left apical calcified granuloma on 33/8.  CT Chest 09/05/21 No indeterminate or suspicious focal pulmonary nodules identified. Dominant nodule within the left apex represents benign calcified granuloma.   Mild centrilobular emphysema.   Mild coronary artery calcification   Aortic Atherosclerosis (ICD10-I70.0) and Emphysema   PFT:    Latest Ref Rng & Units 12/15/2022    9:31 AM  PFT Results  FVC-Pre L 2.50   FVC-Predicted Pre % 91   FVC-Post L 2.54   FVC-Predicted Post % 92   Pre FEV1/FVC % % 76   Post FEV1/FCV % % 76   FEV1-Pre L 1.90   FEV1-Predicted Pre % 92   FEV1-Post L 1.94   DLCO uncorrected ml/min/mmHg 16.11   DLCO UNC% % 86   DLCO corrected ml/min/mmHg 16.11   DLCO COR %Predicted % 86   DLVA Predicted % 85   TLC L 4.89   TLC % Predicted % 99   RV % Predicted % 92     Labs:  Path:  Echo:  Heart Catheterization:  Assessment & Plan:   Centrilobular emphysema (HCC)  Discussion: Sheryl Arias is a 78 year old woman, former smoker who returns to pulmonary clinic for emphysema.   Chronic Obstructive Pulmonary Disease (COPD) Improved symptoms with Spiriva. Rare use of Albuterol. Discussed the importance of deep breathing exercises and maintaining good posture for optimal lung function. -Continue Spiriva daily. -Use  Albuterol as needed.  Follow up in 1 year, call sooner if needed.  Melody Comas, MD [facility] Office: [phone]   Current Outpatient Medications:    albuterol (VENTOLIN HFA) 108 (90 Base) MCG/ACT inhaler, Inhale 2 puffs into the lungs every 6 (six) hours as needed for wheezing or shortness of breath., Disp: 8 g, Rfl: 6   alendronate (FOSAMAX) 70 MG tablet, TAKE 1 TABLET BY MOUTH ONCE A WEEK 30  MINUTES  BEFORE  THE  FIRST  FOOD,  BEVERAGE,  OR  MEDICINE  OF  THE  DAY  WITH  PLAIN  WATER, Disp: , Rfl:    Ascorbic Acid (VITAMIN C) 1000 MG tablet, 1 tablet, Disp: , Rfl:    aspirin 81 MG EC tablet, 1 tablet, Disp: , Rfl:    Calcium Citrate-Vitamin D 315-250 MG-UNIT TABS, 2 tablets, Disp: , Rfl:    cholecalciferol (VITAMIN D3) 25 MCG (1000 UNIT) tablet, Take 3,000 Units by mouth daily., Disp: , Rfl:    rosuvastatin (CRESTOR) 5 MG tablet, Take 1 tablet (5 mg total) by mouth daily., Disp: 90 tablet, Rfl: 6   Tiotropium Bromide Monohydrate (SPIRIVA RESPIMAT) 2.5 MCG/ACT AERS, Inhale 2 puffs into the lungs daily., Disp: 4 g, Rfl: 11   Estradiol 10 MCG TABS vaginal tablet, 1 tablet (Patient not taking: Reported on 09/01/2023), Disp: , Rfl:    umeclidinium bromide (INCRUSE ELLIPTA) 62.5 MCG/ACT AEPB, Inhale 1 puff into the lungs daily. (Patient not taking: Reported on 09/01/2023), Disp: 30 each, Rfl: 5

## 2023-09-08 DIAGNOSIS — Z01419 Encounter for gynecological examination (general) (routine) without abnormal findings: Secondary | ICD-10-CM | POA: Diagnosis not present

## 2023-09-08 DIAGNOSIS — Z1231 Encounter for screening mammogram for malignant neoplasm of breast: Secondary | ICD-10-CM | POA: Diagnosis not present

## 2023-09-08 DIAGNOSIS — Z6826 Body mass index (BMI) 26.0-26.9, adult: Secondary | ICD-10-CM | POA: Diagnosis not present

## 2023-09-22 ENCOUNTER — Encounter: Payer: Self-pay | Admitting: Registered Nurse

## 2023-09-24 ENCOUNTER — Other Ambulatory Visit: Payer: Self-pay

## 2023-09-24 ENCOUNTER — Emergency Department (HOSPITAL_BASED_OUTPATIENT_CLINIC_OR_DEPARTMENT_OTHER)

## 2023-09-24 ENCOUNTER — Emergency Department (HOSPITAL_BASED_OUTPATIENT_CLINIC_OR_DEPARTMENT_OTHER)
Admission: EM | Admit: 2023-09-24 | Discharge: 2023-09-24 | Disposition: A | Discharge status: Home / Self Care | Attending: Emergency Medicine | Admitting: Emergency Medicine

## 2023-09-24 ENCOUNTER — Encounter (HOSPITAL_BASED_OUTPATIENT_CLINIC_OR_DEPARTMENT_OTHER): Payer: Self-pay | Admitting: Emergency Medicine

## 2023-09-24 DIAGNOSIS — S0083XA Contusion of other part of head, initial encounter: Secondary | ICD-10-CM | POA: Diagnosis not present

## 2023-09-24 DIAGNOSIS — Z7982 Long term (current) use of aspirin: Secondary | ICD-10-CM | POA: Insufficient documentation

## 2023-09-24 DIAGNOSIS — Y92410 Unspecified street and highway as the place of occurrence of the external cause: Secondary | ICD-10-CM | POA: Insufficient documentation

## 2023-09-24 DIAGNOSIS — S0990XA Unspecified injury of head, initial encounter: Secondary | ICD-10-CM | POA: Diagnosis not present

## 2023-09-24 NOTE — Discharge Instructions (Signed)
 Please read and follow all provided instructions.  Your diagnoses today include:  1. Injury of head, initial encounter   2. Contusion of face, initial encounter     Tests performed today include: CT scan of your head that did not show any serious injury. Vital signs. See below for your results today.   Medications prescribed:  None  Take any prescribed medications only as directed.  Home care instructions:  Follow any educational materials contained in this packet.  BE VERY CAREFUL not to take multiple medicines containing Tylenol (also called acetaminophen). Doing so can lead to an overdose which can damage your liver and cause liver failure and possibly death.   Follow-up instructions: Please follow-up with your primary care provider as needed for further evaluation of your symptoms.   Return instructions:  SEEK IMMEDIATE MEDICAL ATTENTION IF: There is confusion or drowsiness (although children frequently become drowsy after injury).  You cannot awaken the injured person.  You have more than one episode of vomiting.  You notice dizziness or unsteadiness which is getting worse, or inability to walk.  You have convulsions or unconsciousness.  You experience severe, persistent headaches not relieved by Tylenol. You cannot use arms or legs normally.  There are changes in pupil sizes. (This is the black center in the colored part of the eye)  There is clear or bloody discharge from the nose or ears.  You have change in speech, vision, swallowing, or understanding.  Localized weakness, numbness, tingling, or change in bowel or bladder control. You have any other emergent concerns.  Additional Information: You have had a head injury which does not appear to require admission at this time.  Your vital signs today were: BP (!) 159/71   Pulse 71   Temp 97.6 F (36.4 C) (Oral)   Resp 18   SpO2 98%  If your blood pressure (BP) was elevated above 135/85 this visit, please have  this repeated by your doctor within one month. --------------
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
## 2023-09-27 ENCOUNTER — Ambulatory Visit
Admission: RE | Admit: 2023-09-27 | Discharge: 2023-09-27 | Disposition: A | Discharge status: Home / Self Care | Payer: Medicare Other | Source: Ambulatory Visit | Attending: Registered Nurse | Admitting: Registered Nurse

## 2023-09-27 DIAGNOSIS — R911 Solitary pulmonary nodule: Secondary | ICD-10-CM

## 2023-09-29 DIAGNOSIS — K080 Exfoliation of teeth due to systemic causes: Secondary | ICD-10-CM | POA: Diagnosis not present

## 2023-10-01 ENCOUNTER — Other Ambulatory Visit: Payer: Self-pay | Admitting: Pulmonary Disease

## 2023-10-01 DIAGNOSIS — J432 Centrilobular emphysema: Secondary | ICD-10-CM

## 2023-10-01 DIAGNOSIS — M79631 Pain in right forearm: Secondary | ICD-10-CM | POA: Diagnosis not present

## 2023-10-04 DIAGNOSIS — S0003XD Contusion of scalp, subsequent encounter: Secondary | ICD-10-CM | POA: Diagnosis not present

## 2023-10-06 DIAGNOSIS — K080 Exfoliation of teeth due to systemic causes: Secondary | ICD-10-CM | POA: Diagnosis not present

## 2023-11-02 DIAGNOSIS — J439 Emphysema, unspecified: Secondary | ICD-10-CM | POA: Diagnosis not present

## 2023-11-15 DIAGNOSIS — S61219A Laceration without foreign body of unspecified finger without damage to nail, initial encounter: Secondary | ICD-10-CM | POA: Diagnosis not present

## 2023-11-30 DIAGNOSIS — J439 Emphysema, unspecified: Secondary | ICD-10-CM | POA: Diagnosis not present

## 2023-11-30 DIAGNOSIS — Z9849 Cataract extraction status, unspecified eye: Secondary | ICD-10-CM | POA: Diagnosis not present

## 2023-12-28 ENCOUNTER — Other Ambulatory Visit (HOSPITAL_BASED_OUTPATIENT_CLINIC_OR_DEPARTMENT_OTHER): Payer: Self-pay | Admitting: Family

## 2023-12-28 DIAGNOSIS — I2510 Atherosclerotic heart disease of native coronary artery without angina pectoris: Secondary | ICD-10-CM

## 2023-12-28 DIAGNOSIS — I700 Atherosclerosis of aorta: Secondary | ICD-10-CM

## 2024-03-21 DIAGNOSIS — K080 Exfoliation of teeth due to systemic causes: Secondary | ICD-10-CM | POA: Diagnosis not present

## 2024-03-22 DIAGNOSIS — H53001 Unspecified amblyopia, right eye: Secondary | ICD-10-CM | POA: Diagnosis not present

## 2024-03-22 DIAGNOSIS — Z961 Presence of intraocular lens: Secondary | ICD-10-CM | POA: Diagnosis not present

## 2024-03-22 DIAGNOSIS — D3131 Benign neoplasm of right choroid: Secondary | ICD-10-CM | POA: Diagnosis not present

## 2024-03-23 DIAGNOSIS — M7061 Trochanteric bursitis, right hip: Secondary | ICD-10-CM | POA: Diagnosis not present

## 2024-04-26 ENCOUNTER — Other Ambulatory Visit (HOSPITAL_BASED_OUTPATIENT_CLINIC_OR_DEPARTMENT_OTHER): Payer: Self-pay | Admitting: Family

## 2024-04-26 DIAGNOSIS — I700 Atherosclerosis of aorta: Secondary | ICD-10-CM

## 2024-04-26 DIAGNOSIS — I2510 Atherosclerotic heart disease of native coronary artery without angina pectoris: Secondary | ICD-10-CM

## 2024-05-08 DIAGNOSIS — M4150 Other secondary scoliosis, site unspecified: Secondary | ICD-10-CM | POA: Diagnosis not present

## 2024-05-08 DIAGNOSIS — M7061 Trochanteric bursitis, right hip: Secondary | ICD-10-CM | POA: Diagnosis not present

## 2024-05-08 DIAGNOSIS — M1611 Unilateral primary osteoarthritis, right hip: Secondary | ICD-10-CM | POA: Diagnosis not present

## 2024-05-23 DIAGNOSIS — M25552 Pain in left hip: Secondary | ICD-10-CM | POA: Diagnosis not present

## 2024-05-23 DIAGNOSIS — M25551 Pain in right hip: Secondary | ICD-10-CM | POA: Diagnosis not present

## 2024-05-23 DIAGNOSIS — M545 Low back pain, unspecified: Secondary | ICD-10-CM | POA: Diagnosis not present

## 2024-05-25 ENCOUNTER — Other Ambulatory Visit (HOSPITAL_BASED_OUTPATIENT_CLINIC_OR_DEPARTMENT_OTHER): Payer: Self-pay | Admitting: Family

## 2024-05-25 DIAGNOSIS — I2510 Atherosclerotic heart disease of native coronary artery without angina pectoris: Secondary | ICD-10-CM

## 2024-05-25 DIAGNOSIS — I700 Atherosclerosis of aorta: Secondary | ICD-10-CM

## 2024-05-31 DIAGNOSIS — M25551 Pain in right hip: Secondary | ICD-10-CM | POA: Diagnosis not present

## 2024-06-06 DIAGNOSIS — M25551 Pain in right hip: Secondary | ICD-10-CM | POA: Diagnosis not present

## 2024-06-06 DIAGNOSIS — M4150 Other secondary scoliosis, site unspecified: Secondary | ICD-10-CM | POA: Diagnosis not present

## 2024-07-07 ENCOUNTER — Other Ambulatory Visit: Payer: Self-pay | Admitting: Primary Care
